# Patient Record
Sex: Female | Born: 1955
Health system: Southern US, Community
[De-identification: ages and names within clinical notes are randomized; demographics above are authoritative.]

## PROBLEM LIST (undated history)

## (undated) DIAGNOSIS — I10 Essential (primary) hypertension: Secondary | ICD-10-CM

## (undated) DIAGNOSIS — M1990 Unspecified osteoarthritis, unspecified site: Secondary | ICD-10-CM

## (undated) DIAGNOSIS — K635 Polyp of colon: Secondary | ICD-10-CM

## (undated) DIAGNOSIS — E119 Type 2 diabetes mellitus without complications: Secondary | ICD-10-CM

## (undated) DIAGNOSIS — G43909 Migraine, unspecified, not intractable, without status migrainosus: Secondary | ICD-10-CM

## (undated) DIAGNOSIS — E2839 Other primary ovarian failure: Secondary | ICD-10-CM

## (undated) HISTORY — DX: Unspecified osteoarthritis, unspecified site: M19.90

## (undated) HISTORY — DX: Migraine, unspecified, not intractable, without status migrainosus: G43.909

## (undated) HISTORY — PX: PARTIAL HYSTERECTOMY: SHX80

## (undated) HISTORY — DX: Type 2 diabetes mellitus without complications: E11.9

## (undated) HISTORY — DX: Polyp of colon: K63.5

---

## 2001-06-24 ENCOUNTER — Other Ambulatory Visit: Admission: RE | Admit: 2001-06-24 | Discharge: 2001-06-24 | Payer: Self-pay | Admitting: Obstetrics and Gynecology

## 2001-09-05 ENCOUNTER — Ambulatory Visit (HOSPITAL_COMMUNITY): Admission: RE | Admit: 2001-09-05 | Discharge: 2001-09-05 | Payer: Self-pay | Admitting: Family Medicine

## 2004-02-21 ENCOUNTER — Other Ambulatory Visit: Admission: RE | Admit: 2004-02-21 | Discharge: 2004-02-21 | Payer: Self-pay | Admitting: Internal Medicine

## 2004-03-06 ENCOUNTER — Encounter: Admission: RE | Admit: 2004-03-06 | Discharge: 2004-03-06 | Payer: Self-pay | Admitting: Internal Medicine

## 2005-08-21 ENCOUNTER — Encounter: Admission: RE | Admit: 2005-08-21 | Discharge: 2005-08-21 | Payer: Self-pay | Admitting: Internal Medicine

## 2005-08-31 ENCOUNTER — Encounter: Admission: RE | Admit: 2005-08-31 | Discharge: 2005-08-31 | Payer: Self-pay | Admitting: Internal Medicine

## 2006-02-19 ENCOUNTER — Other Ambulatory Visit: Admission: RE | Admit: 2006-02-19 | Discharge: 2006-02-19 | Payer: Self-pay | Admitting: Internal Medicine

## 2007-05-23 ENCOUNTER — Encounter: Admission: RE | Admit: 2007-05-23 | Discharge: 2007-05-23 | Payer: Self-pay | Admitting: Internal Medicine

## 2009-07-05 ENCOUNTER — Emergency Department (HOSPITAL_COMMUNITY): Admission: EM | Admit: 2009-07-05 | Discharge: 2009-07-06 | Payer: Self-pay | Admitting: Emergency Medicine

## 2010-05-31 ENCOUNTER — Other Ambulatory Visit: Admission: RE | Admit: 2010-05-31 | Discharge: 2010-05-31 | Payer: Self-pay | Admitting: Internal Medicine

## 2010-06-05 ENCOUNTER — Encounter: Admission: RE | Admit: 2010-06-05 | Discharge: 2010-06-05 | Payer: Self-pay | Admitting: Internal Medicine

## 2011-01-19 LAB — BASIC METABOLIC PANEL
Calcium: 9.7 mg/dL (ref 8.4–10.5)
Creatinine, Ser: 0.89 mg/dL (ref 0.4–1.2)
GFR calc non Af Amer: >60 mL/min (ref >60)
Potassium: 3.5 mEq/L (ref 3.5–5.1)
Sodium: 139 mEq/L (ref 135–145)

## 2011-01-19 LAB — POCT CARDIAC MARKERS
Myoglobin, poc: 125 ng/mL (ref 12–200)
Troponin i, poc: <0.05 ng/mL (ref 0.00–0.09)

## 2011-01-19 LAB — CBC
RBC: 4.85 MIL/uL (ref 3.87–5.11)
WBC: 10.1 10*3/uL (ref 4.0–10.5)

## 2011-01-19 LAB — DIFFERENTIAL
Basophils Relative: 0 % (ref 0–1)
Lymphs Abs: 1.8 10*3/uL (ref 0.7–4.0)
Monocytes Relative: 5 % (ref 3–12)
Neutro Abs: 7.8 10*3/uL — ABNORMAL HIGH (ref 1.7–7.7)
Neutrophils Relative %: 77 % (ref 43–77)

## 2012-10-22 ENCOUNTER — Emergency Department (HOSPITAL_COMMUNITY)
Admission: EM | Admit: 2012-10-22 | Discharge: 2012-10-22 | Disposition: A | Discharge status: Home / Self Care | Payer: BC Managed Care – PPO | Attending: Emergency Medicine | Admitting: Emergency Medicine

## 2012-10-22 ENCOUNTER — Encounter (HOSPITAL_COMMUNITY): Payer: Self-pay | Admitting: Emergency Medicine

## 2012-10-22 ENCOUNTER — Emergency Department (HOSPITAL_COMMUNITY): Payer: BC Managed Care – PPO

## 2012-10-22 DIAGNOSIS — Z79899 Other long term (current) drug therapy: Secondary | ICD-10-CM | POA: Insufficient documentation

## 2012-10-22 DIAGNOSIS — R1032 Left lower quadrant pain: Secondary | ICD-10-CM | POA: Insufficient documentation

## 2012-10-22 DIAGNOSIS — R809 Proteinuria, unspecified: Secondary | ICD-10-CM | POA: Insufficient documentation

## 2012-10-22 DIAGNOSIS — R319 Hematuria, unspecified: Secondary | ICD-10-CM

## 2012-10-22 DIAGNOSIS — N390 Urinary tract infection, site not specified: Secondary | ICD-10-CM | POA: Insufficient documentation

## 2012-10-22 DIAGNOSIS — E2839 Other primary ovarian failure: Secondary | ICD-10-CM | POA: Insufficient documentation

## 2012-10-22 DIAGNOSIS — I10 Essential (primary) hypertension: Secondary | ICD-10-CM | POA: Insufficient documentation

## 2012-10-22 HISTORY — DX: Other primary ovarian failure: E28.39

## 2012-10-22 HISTORY — DX: Essential (primary) hypertension: I10

## 2012-10-22 LAB — URINE MICROSCOPIC-ADD ON

## 2012-10-22 LAB — URINALYSIS, ROUTINE W REFLEX MICROSCOPIC
Glucose, UA: NEGATIVE mg/dL
Leukocytes, UA: NEGATIVE
Specific Gravity, Urine: >1.03 — ABNORMAL HIGH (ref 1.005–1.030)
pH: 5 (ref 5.0–8.0)

## 2012-10-22 LAB — CBC WITH DIFFERENTIAL/PLATELET
Basophils Absolute: 0 10*3/uL (ref 0.0–0.1)
Basophils Relative: 0 % (ref 0–1)
Eosinophils Relative: 1 % (ref 0–5)
Lymphocytes Relative: 28 % (ref 12–46)
MCHC: 33.1 g/dL (ref 30.0–36.0)
MCV: 81.7 fL (ref 78.0–100.0)
Platelets: 290 10*3/uL (ref 150–400)
RDW: 13.5 % (ref 11.5–15.5)
WBC: 10.9 10*3/uL — ABNORMAL HIGH (ref 4.0–10.5)

## 2012-10-22 LAB — COMPREHENSIVE METABOLIC PANEL
ALT: 19 U/L (ref 0–35)
AST: 19 U/L (ref 0–37)
Albumin: 4.3 g/dL (ref 3.5–5.2)
CO2: 26 mEq/L (ref 19–32)
Calcium: 10.6 mg/dL — ABNORMAL HIGH (ref 8.4–10.5)
GFR calc non Af Amer: >90 mL/min (ref >90)
Sodium: 139 mEq/L (ref 135–145)
Total Protein: 8.5 g/dL — ABNORMAL HIGH (ref 6.0–8.3)

## 2012-10-22 MED ORDER — NAPROXEN 500 MG PO TABS: 500.0000 mg | ORAL_TABLET | Freq: Two times a day (BID) | ORAL | Status: DC

## 2012-10-22 MED ORDER — ONDANSETRON 4 MG PO TBDP: 4.0000 mg | ORAL_TABLET | Freq: Three times a day (TID) | ORAL | Status: DC | PRN

## 2012-10-22 MED ORDER — SULFAMETHOXAZOLE-TRIMETHOPRIM 800-160 MG PO TABS: 1.0000 | ORAL_TABLET | Freq: Two times a day (BID) | ORAL | Status: DC

## 2012-10-22 NOTE — ED Provider Notes (Signed)
History     CSN: [id number]  Arrival date & time 10/22/12  0010   First MD Initiated Contact with Patient 10/22/12 0239      Chief Complaint  Patient presents with  . Hematuria  . Flank Pain    (Consider location/radiation/quality/duration/timing/severity/associated sxs/prior treatment) HPI Comments: 57 year old female who presents with gradual onset of left lower quadrant abdominal pain. This is associated with hematuria and radiates to her left lower back. Nothing seems to make it better or worse, it is persistent, achy and associated with nausea. She denies a history of kidney stones but does have a history of urinary tract infection a long time ago. She is not diabetic, she is not immunocompromised and she denies fevers or chills. Her symptoms started in the last 12 hours  Patient is a 57 y.o. female presenting with hematuria and flank pain. The history is provided by the patient and a relative.  Hematuria Associated symptoms include flank pain.  Flank Pain    Past Medical History  Diagnosis Date  . Hypertension   . Estrogen deficiency     Past Surgical History  Procedure Date  . Partial hysterectomy     History reviewed. No pertinent family history.  History  Substance Use Topics  . Smoking status: Never Smoker   . Smokeless tobacco: Not on file  . Alcohol Use: No    OB History    Grav Para Term Preterm Abortions TAB SAB Ect Mult Living                  Review of Systems  Genitourinary: Positive for hematuria and flank pain.  All other systems reviewed and are negative.    Allergies  Review of patient's allergies indicates no known allergies.  Home Medications   Current Outpatient Rx  Name  Route  Sig  Dispense  Refill  . AMLODIPINE BESYLATE 5 MG PO TABS   Oral   Take 5 mg by mouth daily.         Marland Kitchen ESTRADIOL 1 MG PO TABS   Oral   Take 1 mg by mouth daily.         Marland Kitchen LISINOPRIL-HYDROCHLOROTHIAZIDE 20-12.5 MG PO TABS   Oral   Take 1  tablet by mouth daily.         Marland Kitchen NAPROXEN 500 MG PO TABS   Oral   Take 1 tablet (500 mg total) by mouth 2 (two) times daily with a meal.   30 tablet   0   . ONDANSETRON 4 MG PO TBDP   Oral   Take 1 tablet (4 mg total) by mouth every 8 (eight) hours as needed for nausea.   10 tablet   0   . SULFAMETHOXAZOLE-TRIMETHOPRIM 800-160 MG PO TABS   Oral   Take 1 tablet by mouth every 12 (twelve) hours.   14 tablet   0     BP 145/89  Pulse 84  Temp 98.6 F (37 C) (Oral)  Resp 16  SpO2 100%  Physical Exam  Nursing note and vitals reviewed. Constitutional: She appears well-developed and well-nourished. No distress.  HENT:  Head: Normocephalic and atraumatic.  Mouth/Throat: Oropharynx is clear and moist. No oropharyngeal exudate.  Eyes: Conjunctivae normal and EOM are normal. Pupils are equal, round, and reactive to light. Right eye exhibits no discharge. Left eye exhibits no discharge. No scleral icterus.  Neck: Normal range of motion. Neck supple. No JVD present. No thyromegaly present.  Cardiovascular: Normal rate, regular rhythm,  normal heart sounds and intact distal pulses.  Exam reveals no gallop and no friction rub.   No murmur heard. Pulmonary/Chest: Effort normal and breath sounds normal. No respiratory distress. She has no wheezes. She has no rales.  Abdominal: Soft. Bowel sounds are normal. She exhibits no distension and no mass. There is no tenderness.  Musculoskeletal: Normal range of motion. She exhibits no edema and no tenderness.  Lymphadenopathy:    She has no cervical adenopathy.  Neurological: She is alert. Coordination normal.  Skin: Skin is warm and dry. No rash noted. No erythema.  Psychiatric: She has a normal mood and affect. Her behavior is normal.    ED Course  Procedures (including critical care time)  Labs Reviewed  CBC WITH DIFFERENTIAL - Abnormal; Notable for the following:    WBC 10.9 (*)     All other components within normal limits    COMPREHENSIVE METABOLIC PANEL - Abnormal; Notable for the following:    Glucose, Bld 118 (*)     Calcium 10.6 (*)     Total Protein 8.5 (*)     Total Bilirubin 0.2 (*)     All other components within normal limits  URINALYSIS, ROUTINE W REFLEX MICROSCOPIC - Abnormal; Notable for the following:    Color, Urine AMBER (*)  BIOCHEMICALS MAY BE AFFECTED BY COLOR   APPearance TURBID (*)     Specific Gravity, Urine >1.030 (*)     Hgb urine dipstick LARGE (*)     Bilirubin Urine SMALL (*)     Protein, ur 100 (*)     Nitrite POSITIVE (*)     All other components within normal limits  URINE MICROSCOPIC-ADD ON - Abnormal; Notable for the following:    Bacteria, UA FEW (*)     All other components within normal limits  LIPASE, BLOOD  URINALYSIS, ROUTINE W REFLEX MICROSCOPIC  URINE CULTURE   Ct Abdomen Pelvis Wo Contrast  10/22/2012  *RADIOLOGY REPORT*  Clinical Data: Hematuria and left flank pain.  CT ABDOMEN AND PELVIS WITHOUT CONTRAST  Technique:  Multidetector CT imaging of the abdomen and pelvis was performed following the standard protocol without intravenous contrast.  Comparison: None.  Findings: The lung bases are clear.  No pleural or pericardial effusion.  There are no renal or ureteral stones and no hydronephrosis.  The kidneys appear normal bilaterally.  The gallbladder, liver, adrenal glands, spleen and pancreas all appear normal.  The patient is status post hysterectomy.  The stomach, small and large bowel and appendix are unremarkable.  There is no lymphadenopathy or fluid. No focal bony abnormality is identified.  IMPRESSION: Negative for urinary tract stone.  Negative exam.   Original Report Authenticated By: Holley Dexter, M.D.      1. UTI (lower urinary tract infection)   2. Hematuria       MDM  The patient is well-appearing with normal vital signs other than hypertension. She has a nontender soft abdomen and no CVA tenderness. Her urinalysis is consistent with  significant hematuria, she also has nitrite positive. There are few bacteria, 3-6 white blood cells. Her blood counts are essentially normal as is her renal function. CT scan of the abdomen and pelvis without contrast to evaluate for kidney stone, the patient declines pain medications or nausea medicines at this time.   CT scan reviewed, there is no signs of abnormality in the abdomen or pelvis, the patient has remained stable throughout her stay, she will be given Bactrim prescription, instructions to  return if symptoms worsen and to followup with family Dr. to evaluate for clearing of hematuria. Patient expresses her understanding.     Vida Roller, MD 10/22/12 [phone]

## 2012-10-22 NOTE — ED Notes (Signed)
Patient complaining of hematuria and left sided flank pain; reports drinking lots of water and cranberry juice today.  Denies history of kidney stone.  Reports nausea; denies vomiting.

## 2012-10-23 LAB — URINE CULTURE

## 2013-02-27 ENCOUNTER — Other Ambulatory Visit: Payer: Self-pay | Admitting: Internal Medicine

## 2013-02-27 ENCOUNTER — Ambulatory Visit
Admission: RE | Admit: 2013-02-27 | Discharge: 2013-02-27 | Disposition: A | Discharge status: Home / Self Care | Payer: BC Managed Care – PPO | Source: Ambulatory Visit | Attending: Internal Medicine | Admitting: Internal Medicine

## 2013-02-27 DIAGNOSIS — Z1231 Encounter for screening mammogram for malignant neoplasm of breast: Secondary | ICD-10-CM

## 2013-03-16 ENCOUNTER — Ambulatory Visit: Payer: BC Managed Care – PPO

## 2013-03-30 ENCOUNTER — Other Ambulatory Visit (HOSPITAL_COMMUNITY)
Admission: RE | Admit: 2013-03-30 | Discharge: 2013-03-30 | Disposition: A | Discharge status: Home / Self Care | Payer: BC Managed Care – PPO | Source: Ambulatory Visit | Attending: Internal Medicine | Admitting: Internal Medicine

## 2013-03-30 ENCOUNTER — Other Ambulatory Visit: Payer: Self-pay | Admitting: Internal Medicine

## 2013-03-30 DIAGNOSIS — Z01419 Encounter for gynecological examination (general) (routine) without abnormal findings: Secondary | ICD-10-CM | POA: Insufficient documentation

## 2013-04-20 ENCOUNTER — Ambulatory Visit (INDEPENDENT_AMBULATORY_CARE_PROVIDER_SITE_OTHER): Payer: BC Managed Care – PPO | Admitting: Neurology

## 2013-04-20 ENCOUNTER — Encounter: Payer: Self-pay | Admitting: Neurology

## 2013-04-20 VITALS — BP 143/93 | HR 89 | Temp 97.2°F | Ht 65.0 in | Wt 172.0 lb

## 2013-04-20 DIAGNOSIS — R209 Unspecified disturbances of skin sensation: Secondary | ICD-10-CM

## 2013-04-20 DIAGNOSIS — R202 Paresthesia of skin: Secondary | ICD-10-CM

## 2013-04-20 DIAGNOSIS — R200 Anesthesia of skin: Secondary | ICD-10-CM

## 2013-04-20 NOTE — Patient Instructions (Signed)
I think overall you are doing fairly well but I do want to suggest a few things today:  Remember to drink plenty of fluid, eat healthy meals and do not skip any meals. Try to eat protein with a every meal and eat a healthy snack such as fruit or nuts in between meals. Try to keep a regular sleep-wake schedule and try to exercise daily, particularly in the form of walking, 20-30 minutes a day, if you can.   As far as your medications are concerned, I would like to suggest    As far as diagnostic testing:   I would like to see you back in 3 months, sooner if we need to. Please call us with any interim questions, concerns, problems, updates or refill requests.  Please also call us for any test results so we can go over those with you on the phone. Steve is my clinical assistant and will answer any of your questions and relay your messages to me and also relay most of my messages to you.  Our phone number is [phone]. We also have an after hours call service for urgent matters and there is a physician on-call for urgent questions. For any emergencies you know to call 911 or go to the nearest emergency room.     

## 2013-04-20 NOTE — Progress Notes (Signed)
Subjective:    Patient ID: Terry [name] is a 57 y.o. female.  HPI  Huston Foley, MD, PhD [facility] [address], [address] [address] [place], [place] [address]  Dear Dr. Donette Larry,   I saw your patient, Terry [name], upon your kind request in my neurologic clinic today for initial consultation of her right-sided numbness and tingling. The patient is unaccompanied today. As you know, Terry [name] is a very pleasant 57 year old right-handed woman with an underlying medical history of hypertension, reflux disease, migraine headaches, allergic rhinitis, IBS, OA, and colonic polyps who has been experiencing right-sided numbness and tingling around the upper chest area and into the right axilla for the past several weeks, now approximately. It started with a stinging and burning sensation in the R breast, and the R nipple and into the R shoulder blade area as well as the R axilla; she was empirically treated for shingles. After 2-3 weeks the painful sensation subsided, but she still has numbness. She had a CXR and a back X-ray. Workup has included B12 level, chemistry, ESR, all reported normal. She has had no rash, no changes to her breast or nipple, no discharge, no peau d'orange. She reports no weakness or clumsiness, no tremor. She denies any radiating neck pain or back pain. She was treated symptomatically with the Lyrica without benefit and reported side effects including drowsiness. A course of acyclovir your did not help even though she did not have a vesicular rash. She had a recent mammogram which was normal in April. Her current medications include Claritin, amlodipine, lisinopril-hydrochlorothiazide, estradiol, and she recently discontinued Lyrica. There is no hx of trauma to the back.  She has an elderly mother with AD, whom she helps take care of. She has a sister, age 38, who was diagnosed with breast ca in her late 65s or early 61s.   Her Past Medical History Is  Significant For: Past Medical History  Diagnosis Date  . Hypertension   . Estrogen deficiency   . Migraine    Her Past Surgical History Is Significant For: Past Surgical History  Procedure Laterality Date  . Partial hysterectomy     Her Family History Is Significant For: Family History  Problem Relation Age of Onset  . Dementia Mother   . Bladder Cancer Father    Her Social History Is Significant For: History   Social History  . Marital Status: Single    Spouse Name: N/A    Number of Children: 2  . Years of Education: 12th   Occupational History  .  [employer]   Social History Main Topics  . Smoking status: Never Smoker   . Smokeless tobacco: Never Used  . Alcohol Use: Yes     Comment: socially  . Drug Use: No  . Sexually Active: None   Other Topics Concern  . None   Social History Narrative   Pt lives at home with family.   Caffeine Use: 1-2 cups occasionally    Her Allergies Are:  No Known Allergies:   Her Current Medications Are:  Outpatient Encounter Prescriptions as of 04/20/2013  Medication Sig Dispense Refill  . amLODipine (NORVASC) 5 MG tablet Take 5 mg by mouth daily.      Marland Kitchen estradiol (ESTRACE) 1 MG tablet Take 1 mg by mouth daily.      Marland Kitchen lisinopril-hydrochlorothiazide (PRINZIDE,ZESTORETIC) 20-12.5 MG per tablet Take 1 tablet by mouth daily.      . [DISCONTINUED] naproxen (NAPROSYN) 500  MG tablet Take 1 tablet (500 mg total) by mouth 2 (two) times daily with a meal.  30 tablet  0  . [DISCONTINUED] ondansetron (ZOFRAN ODT) 4 MG disintegrating tablet Take 1 tablet (4 mg total) by mouth every 8 (eight) hours as needed for nausea.  10 tablet  0  . [DISCONTINUED] sulfamethoxazole-trimethoprim (SEPTRA DS) 800-160 MG per tablet Take 1 tablet by mouth every 12 (twelve) hours.  14 tablet  0   No facility-administered encounter medications on file as of 04/20/2013.   Review of Systems  Neurological: Positive for numbness.    Objective:   Neurologic Exam  Physical Exam Physical Examination:   Filed Vitals:   04/20/13 0829  BP: 143/93  Pulse: 89  Temp: 97.2 F (36.2 C)    General Examination: The patient is a very pleasant 57 y.o. female in no acute distress. She appears well-developed and well-nourished and well groomed.   HEENT: Normocephalic, atraumatic, pupils are equal, round and reactive to light and accommodation. Funduscopic exam is normal with sharp disc margins noted. Extraocular tracking is good without limitation to gaze excursion or nystagmus noted. Normal smooth pursuit is noted. Hearing is grossly intact. Tympanic membranes are clear bilaterally. Face is symmetric with normal facial animation and normal facial sensation. Speech is clear with no dysarthria noted. There is no hypophonia. There is no lip, neck/head, jaw or voice tremor. Neck is supple with full range of passive and active motion. There are no carotid bruits on auscultation. Oropharynx exam reveals: mild mouth dryness, adequate dental hygiene and moderate airway crowding, due to large tongue and narrow airway. Mallampati is class II. Tongue protrudes centrally and palate elevates symmetrically. No cervical lymphadenopathy is noted.  Chest: Clear to auscultation without wheezing, rhonchi or crackles noted.  Heart: S1+S2+0, regular and normal without murmurs, rubs or gallops noted.   Abdomen: Soft, non-tender and non-distended with normal bowel sounds appreciated on auscultation.  Extremities: There is no pitting edema in the distal lower extremities bilaterally. Pedal pulses are intact.  Skin: Warm and dry without trophic changes noted. There are no varicose veins.  Musculoskeletal: exam reveals no obvious joint deformities, tenderness or joint swelling or erythema.   Neurologically:  Mental status: The patient is awake, alert and oriented in all 4 spheres. Her memory, attention, language and knowledge are appropriate. There is no aphasia,  agnosia, apraxia or anomia. Speech is clear with normal prosody and enunciation. Thought process is linear. Mood is congruent and affect is normal.  Cranial nerves are as described above under HEENT exam. In addition, shoulder shrug is normal with equal shoulder height noted. Motor exam: Normal bulk, strength and tone is noted. There is no drift, tremor or rebound. Romberg is negative. Reflexes are only trace throughout. Toes are downgoing bilaterally. Fine motor skills are intact with normal finger taps, normal hand movements, normal rapid alternating patting, normal foot taps and normal foot agility.  Cerebellar testing shows no dysmetria or intention tremor on finger to nose testing. Heel to shin is unremarkable bilaterally. There is no truncal or gait ataxia.  Sensory exam is intact to light touch, pinprick, vibration, temperature sense and proprioception in the upper and lower extremities with the exception of slightly decreased pinprick sensation in the right breast area in the T9 distribution. Her axilla has normal sensation as well as the right shoulder blade and deltoid area. She has no lymphadenopathy in her cervical area her supra-clavicular area or right axilla. She has no palpable breast mass. Gait, station  and balance are unremarkable. No veering to one side is noted. No leaning to one side is noted. Posture is age-appropriate and stance is narrow based. No problems turning are noted. She turns en bloc. Tandem walk is unremarkable. Intact toe and heel stance is noted.               Assessment and Plan:   Assessment and Plan:  In summary, TENESHA GARZA is a very pleasant 58 y.o.-year old female with a history of burning sensation, tingling and numbness in the right breast area, right axilla, and right shoulder blade area. While the burning sensation and   uncomfortable sensation have subsided, she still reports numbness in those areas. Objectively she has normal sensation with the exception  of mild decrease in pinprick sensation in the right breast area. Otherwise her exam is benign and nonfocal and I reassured the patient in that regard.  I had a long chat with the patient about my findings and her symptoms and I am not sure how to tie this all in together. Nevertheless I think it would be important to rule out any cervical or thoracic cord lesion. This is not present like a TIA or stroke as you know. Therefore, I will order a cervical and thoracic spine MRI at this time. I would like see her back in a couple of months for recheck. To some degree she has improved in that the burning sensation has subsided. We may have to watch and wait it out. We will be calling her with her test results. I did not order any blood work today.  Thank you very much for allowing me to participate in the care of this nice patient. If I can be of any further assistance to you please do not hesitate to call me at [phone].  Sincerely,   Huston Foley, MD, PhD

## 2013-07-27 ENCOUNTER — Ambulatory Visit: Payer: BC Managed Care – PPO | Admitting: Neurology

## 2014-06-24 ENCOUNTER — Other Ambulatory Visit: Payer: Self-pay | Admitting: Internal Medicine

## 2014-06-24 DIAGNOSIS — R142 Eructation: Secondary | ICD-10-CM

## 2014-06-30 ENCOUNTER — Ambulatory Visit
Admission: RE | Admit: 2014-06-30 | Discharge: 2014-06-30 | Disposition: A | Discharge status: Home / Self Care | Payer: BC Managed Care – PPO | Source: Ambulatory Visit | Attending: Internal Medicine | Admitting: Internal Medicine

## 2014-06-30 DIAGNOSIS — R142 Eructation: Secondary | ICD-10-CM

## 2014-09-01 ENCOUNTER — Other Ambulatory Visit: Payer: Self-pay | Admitting: Internal Medicine

## 2014-09-01 ENCOUNTER — Ambulatory Visit
Admission: RE | Admit: 2014-09-01 | Discharge: 2014-09-01 | Disposition: A | Discharge status: Home / Self Care | Payer: BC Managed Care – PPO | Source: Ambulatory Visit | Attending: Internal Medicine | Admitting: Internal Medicine

## 2014-09-01 ENCOUNTER — Encounter (HOSPITAL_COMMUNITY): Payer: Self-pay | Admitting: *Deleted

## 2014-09-01 ENCOUNTER — Emergency Department (HOSPITAL_COMMUNITY)
Admission: EM | Admit: 2014-09-01 | Discharge: 2014-09-01 | Disposition: A | Discharge status: Home / Self Care | Payer: BC Managed Care – PPO | Attending: Emergency Medicine | Admitting: Emergency Medicine

## 2014-09-01 DIAGNOSIS — G43909 Migraine, unspecified, not intractable, without status migrainosus: Secondary | ICD-10-CM | POA: Insufficient documentation

## 2014-09-01 DIAGNOSIS — I10 Essential (primary) hypertension: Secondary | ICD-10-CM | POA: Insufficient documentation

## 2014-09-01 DIAGNOSIS — Z79899 Other long term (current) drug therapy: Secondary | ICD-10-CM | POA: Insufficient documentation

## 2014-09-01 DIAGNOSIS — R112 Nausea with vomiting, unspecified: Secondary | ICD-10-CM | POA: Insufficient documentation

## 2014-09-01 DIAGNOSIS — E860 Dehydration: Secondary | ICD-10-CM | POA: Diagnosis present

## 2014-09-01 DIAGNOSIS — F502 Bulimia nervosa: Secondary | ICD-10-CM

## 2014-09-01 LAB — COMPREHENSIVE METABOLIC PANEL
ALT: 17 U/L (ref 0–35)
AST: 17 U/L (ref 0–37)
Albumin: 4.4 g/dL (ref 3.5–5.2)
Alkaline Phosphatase: 43 U/L (ref 39–117)
Anion gap: 19 — ABNORMAL HIGH (ref 5–15)
BUN: 19 mg/dL (ref 6–23)
CALCIUM: 10.3 mg/dL (ref 8.4–10.5)
CO2: 25 mEq/L (ref 19–32)
Chloride: 98 mEq/L (ref 96–112)
Creatinine, Ser: 0.69 mg/dL (ref 0.50–1.10)
GFR calc Af Amer: >90 mL/min (ref >90)
GFR calc non Af Amer: >90 mL/min (ref >90)
Glucose, Bld: 147 mg/dL — ABNORMAL HIGH (ref 70–99)
Potassium: 3.5 mEq/L — ABNORMAL LOW (ref 3.7–5.3)
SODIUM: 142 meq/L (ref 137–147)
TOTAL PROTEIN: 9 g/dL — AB (ref 6.0–8.3)
Total Bilirubin: 0.4 mg/dL (ref 0.3–1.2)

## 2014-09-01 LAB — CBC WITH DIFFERENTIAL/PLATELET
BASOS ABS: 0 10*3/uL (ref 0.0–0.1)
Basophils Relative: 0 % (ref 0–1)
EOS ABS: 0 10*3/uL (ref 0.0–0.7)
EOS PCT: 0 % (ref 0–5)
HCT: 45.4 % (ref 36.0–46.0)
Hemoglobin: 15 g/dL (ref 12.0–15.0)
LYMPHS PCT: 13 % (ref 12–46)
Lymphs Abs: 1.5 10*3/uL (ref 0.7–4.0)
MCH: 27.5 pg (ref 26.0–34.0)
MCHC: 33 g/dL (ref 30.0–36.0)
MCV: 83.2 fL (ref 78.0–100.0)
Monocytes Absolute: 0.5 10*3/uL (ref 0.1–1.0)
Monocytes Relative: 4 % (ref 3–12)
Neutro Abs: 9.7 10*3/uL — ABNORMAL HIGH (ref 1.7–7.7)
Neutrophils Relative %: 83 % — ABNORMAL HIGH (ref 43–77)
PLATELETS: 369 10*3/uL (ref 150–400)
RBC: 5.46 MIL/uL — ABNORMAL HIGH (ref 3.87–5.11)
RDW: 13.7 % (ref 11.5–15.5)
WBC: 11.7 10*3/uL — AB (ref 4.0–10.5)

## 2014-09-01 LAB — LIPASE, BLOOD: Lipase: 21 U/L (ref 11–59)

## 2014-09-01 MED ORDER — ONDANSETRON 8 MG PO TBDP: 8.0000 mg | ORAL_TABLET | Freq: Three times a day (TID) | ORAL | Status: DC | PRN

## 2014-09-01 MED ORDER — ONDANSETRON HCL 4 MG/2ML IJ SOLN
4.0000 mg | Freq: Once | INTRAMUSCULAR | Status: AC
  Administered 2014-09-01: 4 mg via INTRAVENOUS
  Filled 2014-09-01: qty 2

## 2014-09-01 MED ORDER — SODIUM CHLORIDE 0.9 % IV BOLUS (SEPSIS)
1000.0000 mL | Freq: Once | INTRAVENOUS | Status: AC
  Administered 2014-09-01: 1000 mL via INTRAVENOUS

## 2014-09-01 NOTE — ED Notes (Signed)
Patient not able to void at this time. Patient aware we need a ua.

## 2014-09-01 NOTE — ED Notes (Signed)
Pt states she cannot void quite yet.

## 2014-09-01 NOTE — Discharge Instructions (Signed)

## 2014-09-01 NOTE — ED Provider Notes (Signed)
CSN: [id number]     Arrival date & time 09/01/14  1722 History   First MD Initiated Contact with Patient 09/01/14 1817     Chief Complaint  Patient presents with  . Emesis  . Dehydration     (Consider location/radiation/quality/duration/timing/severity/associated sxs/prior Treatment) Patient is a 58 y.o. female presenting with vomiting.  Emesis Associated symptoms: no abdominal pain, no diarrhea and no headaches    patient with nausea and vomiting. Has had it the last couple days. She's had some lightheadedness. No abdominal pain or diarrhea. No chest pain. No fevers. No history of same. Was seen in primary care doctor and was told to come to the ER because of dehydration. No dysuria. No sick contacts.  Past Medical History  Diagnosis Date  . Hypertension   . Estrogen deficiency   . Migraine    Past Surgical History  Procedure Laterality Date  . Partial hysterectomy     Family History  Problem Relation Age of Onset  . Dementia Mother   . Bladder Cancer Father    History  Substance Use Topics  . Smoking status: Never Smoker   . Smokeless tobacco: Never Used  . Alcohol Use: Yes     Comment: socially   OB History    No data available     Review of Systems  Constitutional: Negative for activity change and appetite change.  Eyes: Negative for pain.  Respiratory: Negative for chest tightness and shortness of breath.   Cardiovascular: Negative for chest pain and leg swelling.  Gastrointestinal: Positive for nausea and vomiting. Negative for abdominal pain and diarrhea.  Genitourinary: Negative for flank pain.  Musculoskeletal: Negative for back pain and neck stiffness.  Skin: Negative for rash.  Neurological: Positive for light-headedness. Negative for weakness, numbness and headaches.  Psychiatric/Behavioral: Negative for behavioral problems.      Allergies  Review of patient's allergies indicates no known allergies.  Home Medications   Prior to Admission  medications   Medication Sig Start Date End Date Taking? Authorizing Provider  amLODipine (NORVASC) 5 MG tablet Take 5 mg by mouth daily.   Yes Historical Provider, MD  estradiol (ESTRACE) 1 MG tablet Take 1 mg by mouth daily.   Yes Historical Provider, MD  imipramine (TOFRANIL) 25 MG tablet Take 25 mg by mouth at bedtime.   Yes Historical Provider, MD  lisinopril-hydrochlorothiazide (PRINZIDE,ZESTORETIC) 20-12.5 MG per tablet Take 1 tablet by mouth daily.   Yes Historical Provider, MD  omeprazole (PRILOSEC) 40 MG capsule Take 40 mg by mouth daily.   Yes Historical Provider, MD  ondansetron (ZOFRAN-ODT) 8 MG disintegrating tablet Take 1 tablet (8 mg total) by mouth every 8 (eight) hours as needed for nausea or vomiting. 09/01/14   Juliet RudeNathan R. Paulett Kaufhold, MD   BP 117/58 mmHg  Pulse 79  Temp(Src) 98.7 F (37.1 C) (Oral)  Resp 17  Ht 5\' 5"  (1.651 m)  Wt 158 lb (71.668 kg)  BMI 26.29 kg/m2  SpO2 96% Physical Exam  Constitutional: She is oriented to person, place, and time. She appears well-developed and well-nourished.  HENT:  Head: Normocephalic and atraumatic.  Cardiovascular: Normal rate, regular rhythm and normal heart sounds.   No murmur heard. Pulmonary/Chest: Effort normal and breath sounds normal. No respiratory distress. She has no wheezes. She has no rales.  Abdominal: Soft. She exhibits no distension. There is no tenderness. There is no rebound and no guarding.  Musculoskeletal: She exhibits no edema or tenderness.  Neurological: She is alert and oriented to  person, place, and time.  Skin: Skin is warm and dry.  Psychiatric: She has a normal mood and affect. Her speech is normal.  Nursing note and vitals reviewed.   ED Course  Procedures (including critical care time) Labs Review Labs Reviewed  CBC WITH DIFFERENTIAL - Abnormal; Notable for the following:    WBC 11.7 (*)    RBC 5.46 (*)    Neutrophils Relative % 83 (*)    Neutro Abs 9.7 (*)    All other components within  normal limits  COMPREHENSIVE METABOLIC PANEL - Abnormal; Notable for the following:    Potassium 3.5 (*)    Glucose, Bld 147 (*)    Total Protein 9.0 (*)    Anion gap 19 (*)    All other components within normal limits  LIPASE, BLOOD    Imaging Review Dg Abd 1 View  09/01/2014   CLINICAL DATA:  Vomiting, nausea, loss of appetite recently  EXAM: ABDOMEN - 1 VIEW  COMPARISON:  Upper GI of 06/30/2014  FINDINGS: Supine views of the abdomen show no bowel obstruction. No constipation is seen. In review of the upper GI, the distal antrum is slightly narrowed on several images. If there is suspicion of an infiltrative process than endoscopy would be recommended.  IMPRESSION: No bowel obstruction. No opaque calculi. Consider endoscopy as noted above if further assessment of the stomach is warranted.   Electronically Signed   By: Dwyane DeePaul  Barry M.D.   On: 09/01/2014 17:15     EKG Interpretation None      MDM   Final diagnoses:  Non-intractable vomiting with nausea, vomiting of unspecified type    Patient with nausea and vomiting. Feels better after treatment. Lab work overall reassuring. Has tolerated orals after Zofran and IV fluids. Will follow-up with PCP as needed    Juliet RudeNathan R. Rubin PayorPickering, MD 09/01/14 2352

## 2014-09-01 NOTE — ED Notes (Signed)
Pt reports having n/v since Sunday night. Denies abd pain or vomiting. Reports last bm was Friday or Saturday. Sent here by pcp for treatment of dehydration. No acute distress noted at triage.

## 2015-01-07 ENCOUNTER — Other Ambulatory Visit: Payer: Self-pay | Admitting: Orthopedic Surgery

## 2015-01-07 DIAGNOSIS — M79672 Pain in left foot: Secondary | ICD-10-CM

## 2015-01-11 ENCOUNTER — Ambulatory Visit
Admission: RE | Admit: 2015-01-11 | Discharge: 2015-01-11 | Disposition: A | Discharge status: Home / Self Care | Payer: PRIVATE HEALTH INSURANCE | Source: Ambulatory Visit | Attending: Orthopedic Surgery | Admitting: Orthopedic Surgery

## 2015-01-11 DIAGNOSIS — M79672 Pain in left foot: Secondary | ICD-10-CM

## 2015-08-29 ENCOUNTER — Other Ambulatory Visit: Payer: Self-pay | Admitting: Internal Medicine

## 2015-08-29 DIAGNOSIS — Z1231 Encounter for screening mammogram for malignant neoplasm of breast: Secondary | ICD-10-CM

## 2015-09-19 ENCOUNTER — Ambulatory Visit
Admission: RE | Admit: 2015-09-19 | Discharge: 2015-09-19 | Disposition: A | Discharge status: Home / Self Care | Payer: Managed Care, Other (non HMO) | Source: Ambulatory Visit | Attending: Internal Medicine | Admitting: Internal Medicine

## 2015-09-19 DIAGNOSIS — Z1231 Encounter for screening mammogram for malignant neoplasm of breast: Secondary | ICD-10-CM

## 2016-04-27 ENCOUNTER — Other Ambulatory Visit: Payer: Self-pay | Admitting: Internal Medicine

## 2016-04-27 DIAGNOSIS — Z1231 Encounter for screening mammogram for malignant neoplasm of breast: Secondary | ICD-10-CM

## 2016-04-30 ENCOUNTER — Ambulatory Visit
Admission: RE | Admit: 2016-04-30 | Discharge: 2016-04-30 | Disposition: A | Discharge status: Home / Self Care | Payer: Managed Care, Other (non HMO) | Source: Ambulatory Visit | Attending: Internal Medicine | Admitting: Internal Medicine

## 2016-04-30 ENCOUNTER — Other Ambulatory Visit: Payer: Self-pay | Admitting: Internal Medicine

## 2016-04-30 DIAGNOSIS — M25511 Pain in right shoulder: Secondary | ICD-10-CM

## 2016-04-30 DIAGNOSIS — N644 Mastodynia: Secondary | ICD-10-CM

## 2016-05-04 ENCOUNTER — Ambulatory Visit
Admission: RE | Admit: 2016-05-04 | Discharge: 2016-05-04 | Disposition: A | Discharge status: Home / Self Care | Payer: Managed Care, Other (non HMO) | Source: Ambulatory Visit | Attending: Internal Medicine | Admitting: Internal Medicine

## 2016-05-04 DIAGNOSIS — N644 Mastodynia: Secondary | ICD-10-CM

## 2017-02-18 ENCOUNTER — Other Ambulatory Visit (HOSPITAL_COMMUNITY)
Admission: RE | Admit: 2017-02-18 | Discharge: 2017-02-18 | Disposition: A | Discharge status: Home / Self Care | Payer: Managed Care, Other (non HMO) | Source: Ambulatory Visit | Attending: Obstetrics and Gynecology | Admitting: Obstetrics and Gynecology

## 2017-02-18 ENCOUNTER — Other Ambulatory Visit: Payer: Self-pay | Admitting: Obstetrics and Gynecology

## 2017-02-18 DIAGNOSIS — Z01411 Encounter for gynecological examination (general) (routine) with abnormal findings: Secondary | ICD-10-CM | POA: Insufficient documentation

## 2017-02-18 DIAGNOSIS — Z1151 Encounter for screening for human papillomavirus (HPV): Secondary | ICD-10-CM | POA: Insufficient documentation

## 2017-02-21 LAB — CYTOLOGY - PAP: HPV (WINDOPATH): NOT DETECTED

## 2017-05-13 ENCOUNTER — Other Ambulatory Visit: Payer: Self-pay | Admitting: Internal Medicine

## 2017-05-13 ENCOUNTER — Ambulatory Visit
Admission: RE | Admit: 2017-05-13 | Discharge: 2017-05-13 | Disposition: A | Discharge status: Home / Self Care | Payer: Managed Care, Other (non HMO) | Source: Ambulatory Visit | Attending: Internal Medicine | Admitting: Internal Medicine

## 2017-05-13 DIAGNOSIS — M79641 Pain in right hand: Secondary | ICD-10-CM

## 2017-05-13 DIAGNOSIS — M79642 Pain in left hand: Principal | ICD-10-CM

## 2017-07-15 ENCOUNTER — Other Ambulatory Visit: Payer: Self-pay | Admitting: Internal Medicine

## 2017-07-15 DIAGNOSIS — Z1239 Encounter for other screening for malignant neoplasm of breast: Secondary | ICD-10-CM

## 2017-07-16 ENCOUNTER — Ambulatory Visit
Admission: RE | Admit: 2017-07-16 | Discharge: 2017-07-16 | Disposition: A | Discharge status: Home / Self Care | Payer: Managed Care, Other (non HMO) | Source: Ambulatory Visit | Attending: Internal Medicine | Admitting: Internal Medicine

## 2017-07-16 DIAGNOSIS — Z1239 Encounter for other screening for malignant neoplasm of breast: Secondary | ICD-10-CM

## 2017-07-18 ENCOUNTER — Other Ambulatory Visit: Payer: Self-pay | Admitting: Internal Medicine

## 2017-07-18 DIAGNOSIS — R928 Other abnormal and inconclusive findings on diagnostic imaging of breast: Secondary | ICD-10-CM

## 2017-07-22 ENCOUNTER — Ambulatory Visit: Admission: RE | Admit: 2017-07-22 | Payer: Managed Care, Other (non HMO) | Source: Ambulatory Visit

## 2017-07-22 ENCOUNTER — Ambulatory Visit
Admission: RE | Admit: 2017-07-22 | Discharge: 2017-07-22 | Disposition: A | Discharge status: Home / Self Care | Payer: Managed Care, Other (non HMO) | Source: Ambulatory Visit | Attending: Internal Medicine | Admitting: Internal Medicine

## 2017-07-22 DIAGNOSIS — R928 Other abnormal and inconclusive findings on diagnostic imaging of breast: Secondary | ICD-10-CM

## 2017-09-02 ENCOUNTER — Ambulatory Visit
Admission: RE | Admit: 2017-09-02 | Discharge: 2017-09-02 | Disposition: A | Discharge status: Home / Self Care | Payer: Managed Care, Other (non HMO) | Source: Ambulatory Visit | Attending: Internal Medicine | Admitting: Internal Medicine

## 2017-09-02 ENCOUNTER — Other Ambulatory Visit: Payer: Self-pay | Admitting: Internal Medicine

## 2017-09-02 DIAGNOSIS — M25512 Pain in left shoulder: Secondary | ICD-10-CM

## 2017-09-02 DIAGNOSIS — M25511 Pain in right shoulder: Secondary | ICD-10-CM

## 2017-12-31 IMAGING — DX DG SHOULDER 2+V*R*
3 series · 3 of 3 positions shown · non-contrast
Comparison: Right shoulder series dated April 30, 2016

CLINICAL DATA: Chronic right shoulder pain with decreased range of
motion for many years. No known injury.

EXAM:
RIGHT SHOULDER - 2+ VIEW

[dg shoulder right (1 of 3)]
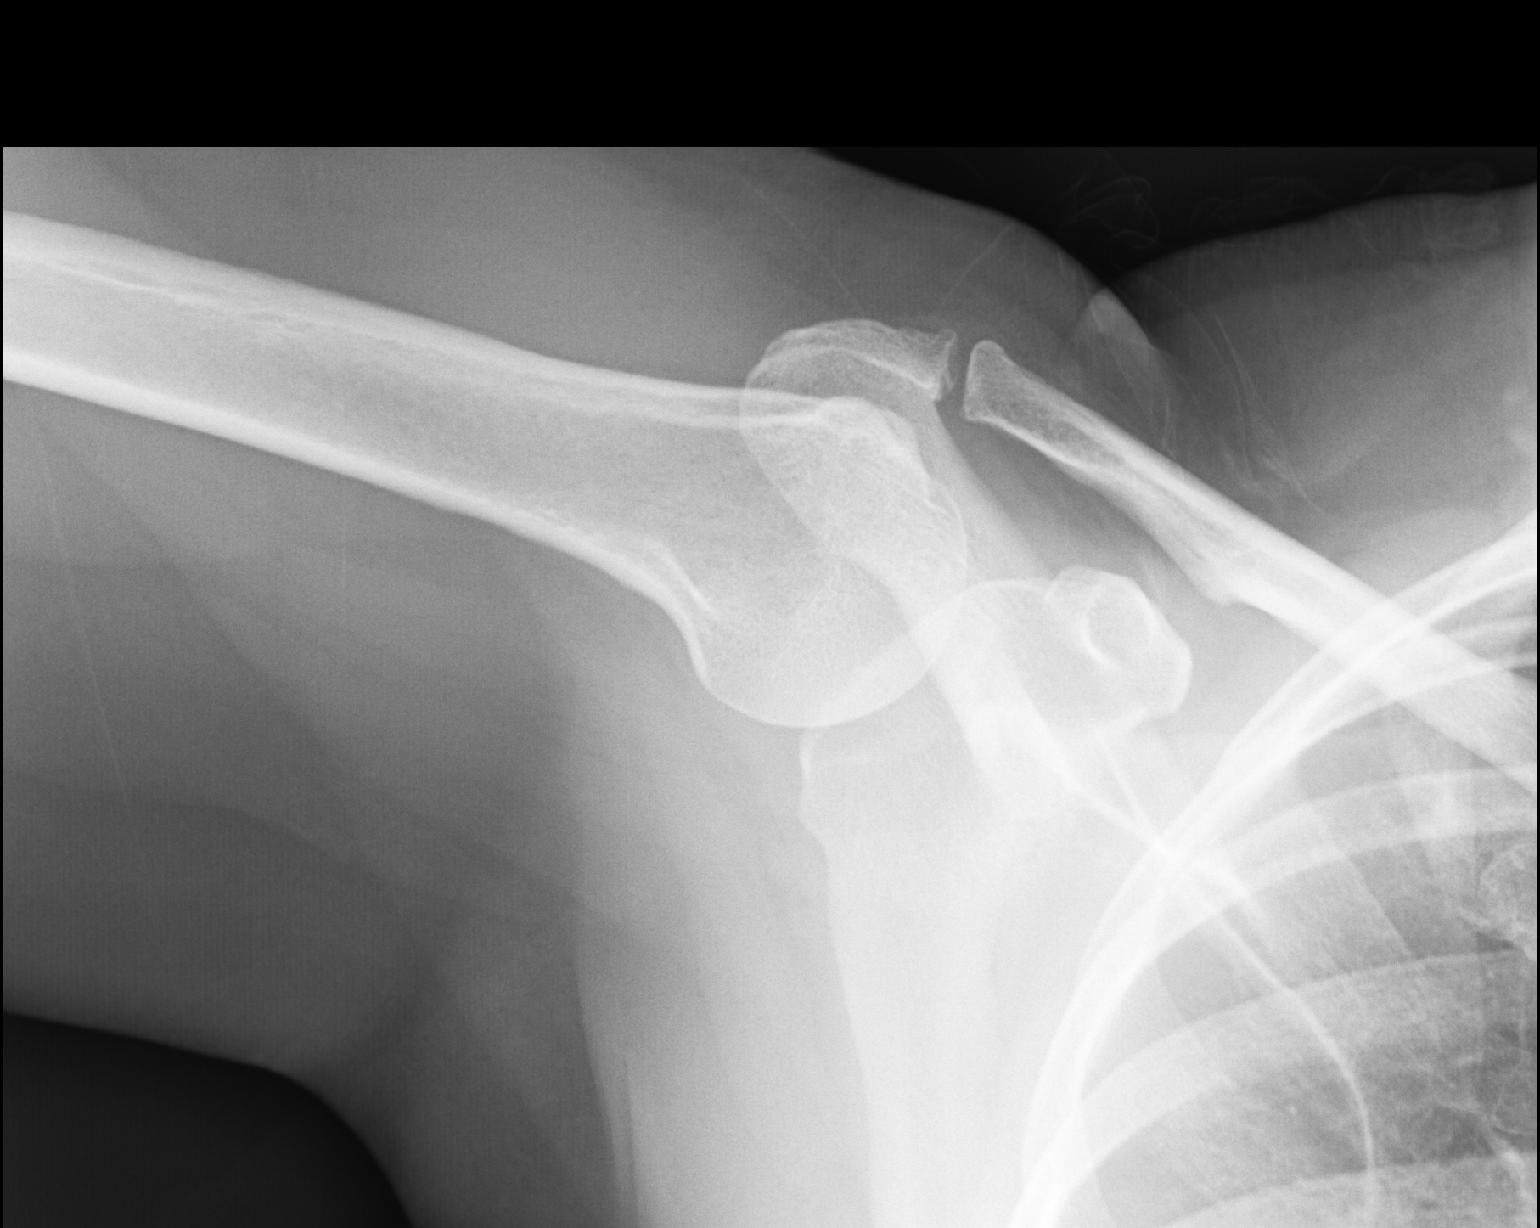

[dg shoulder right (2 of 3)]
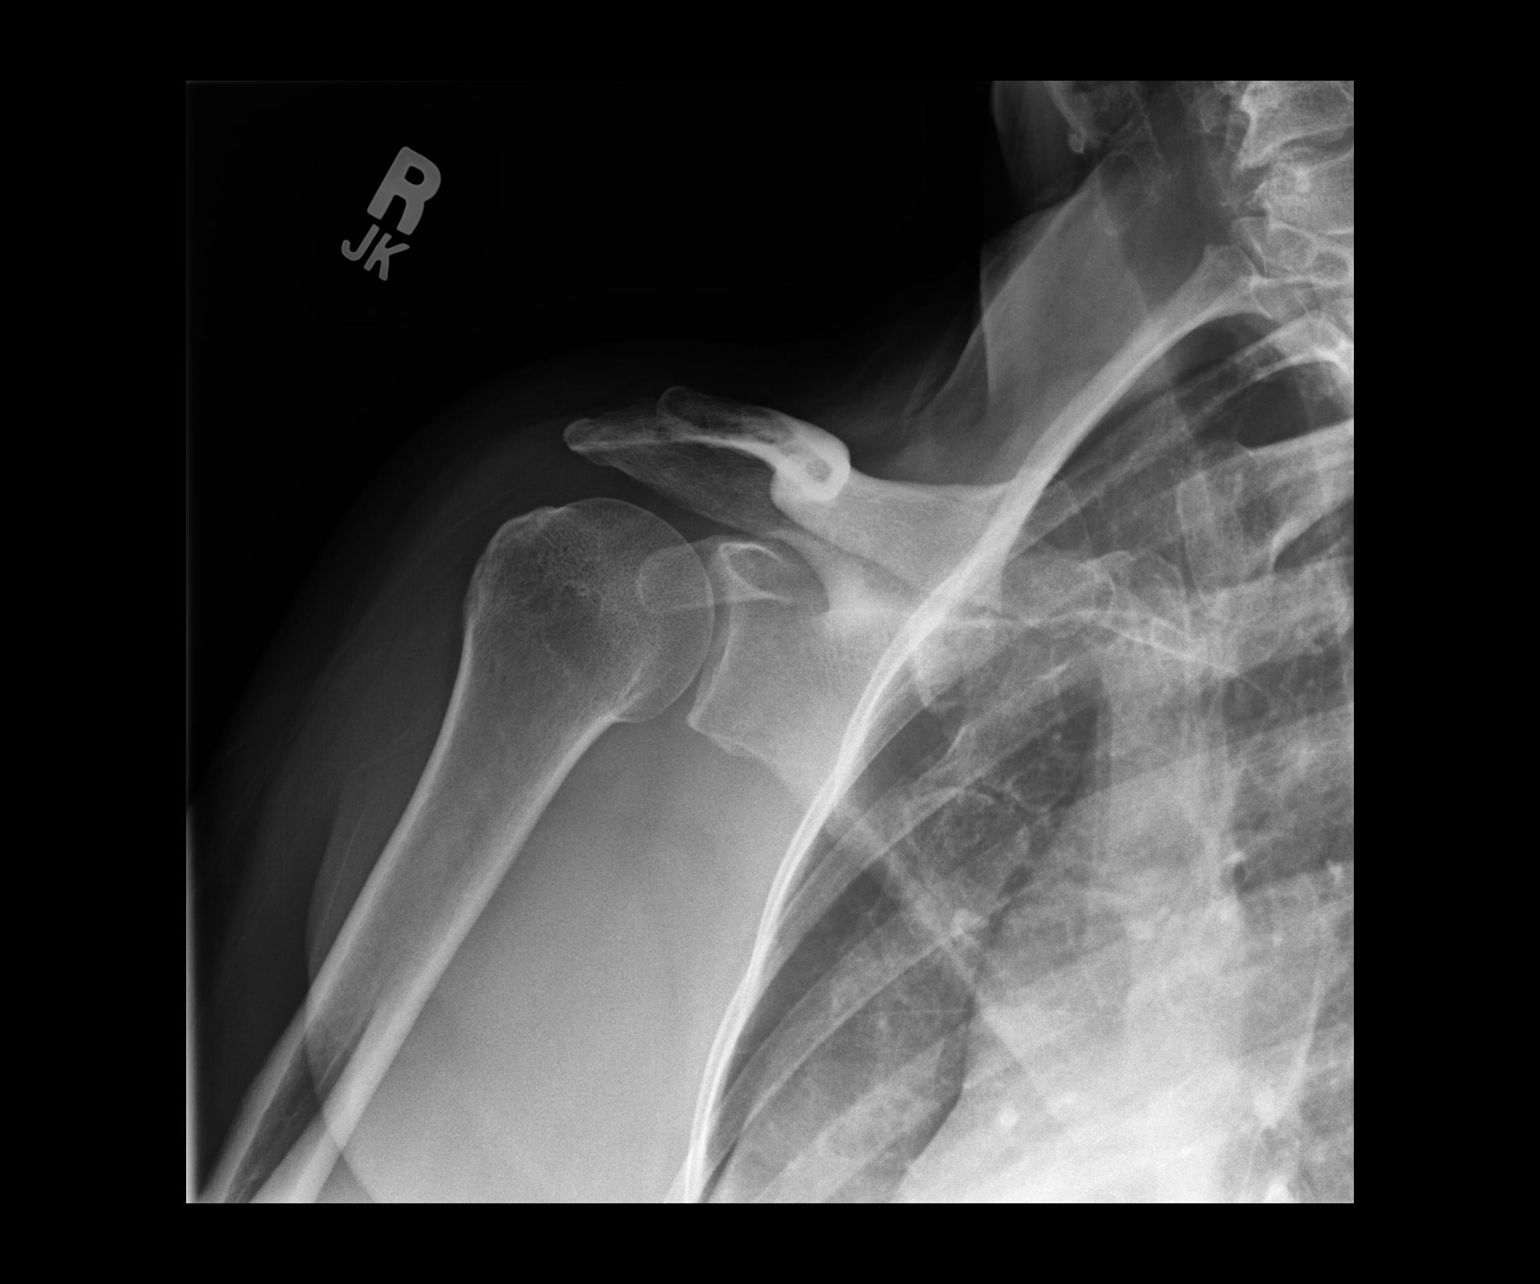

[dg shoulder right (3 of 3)]
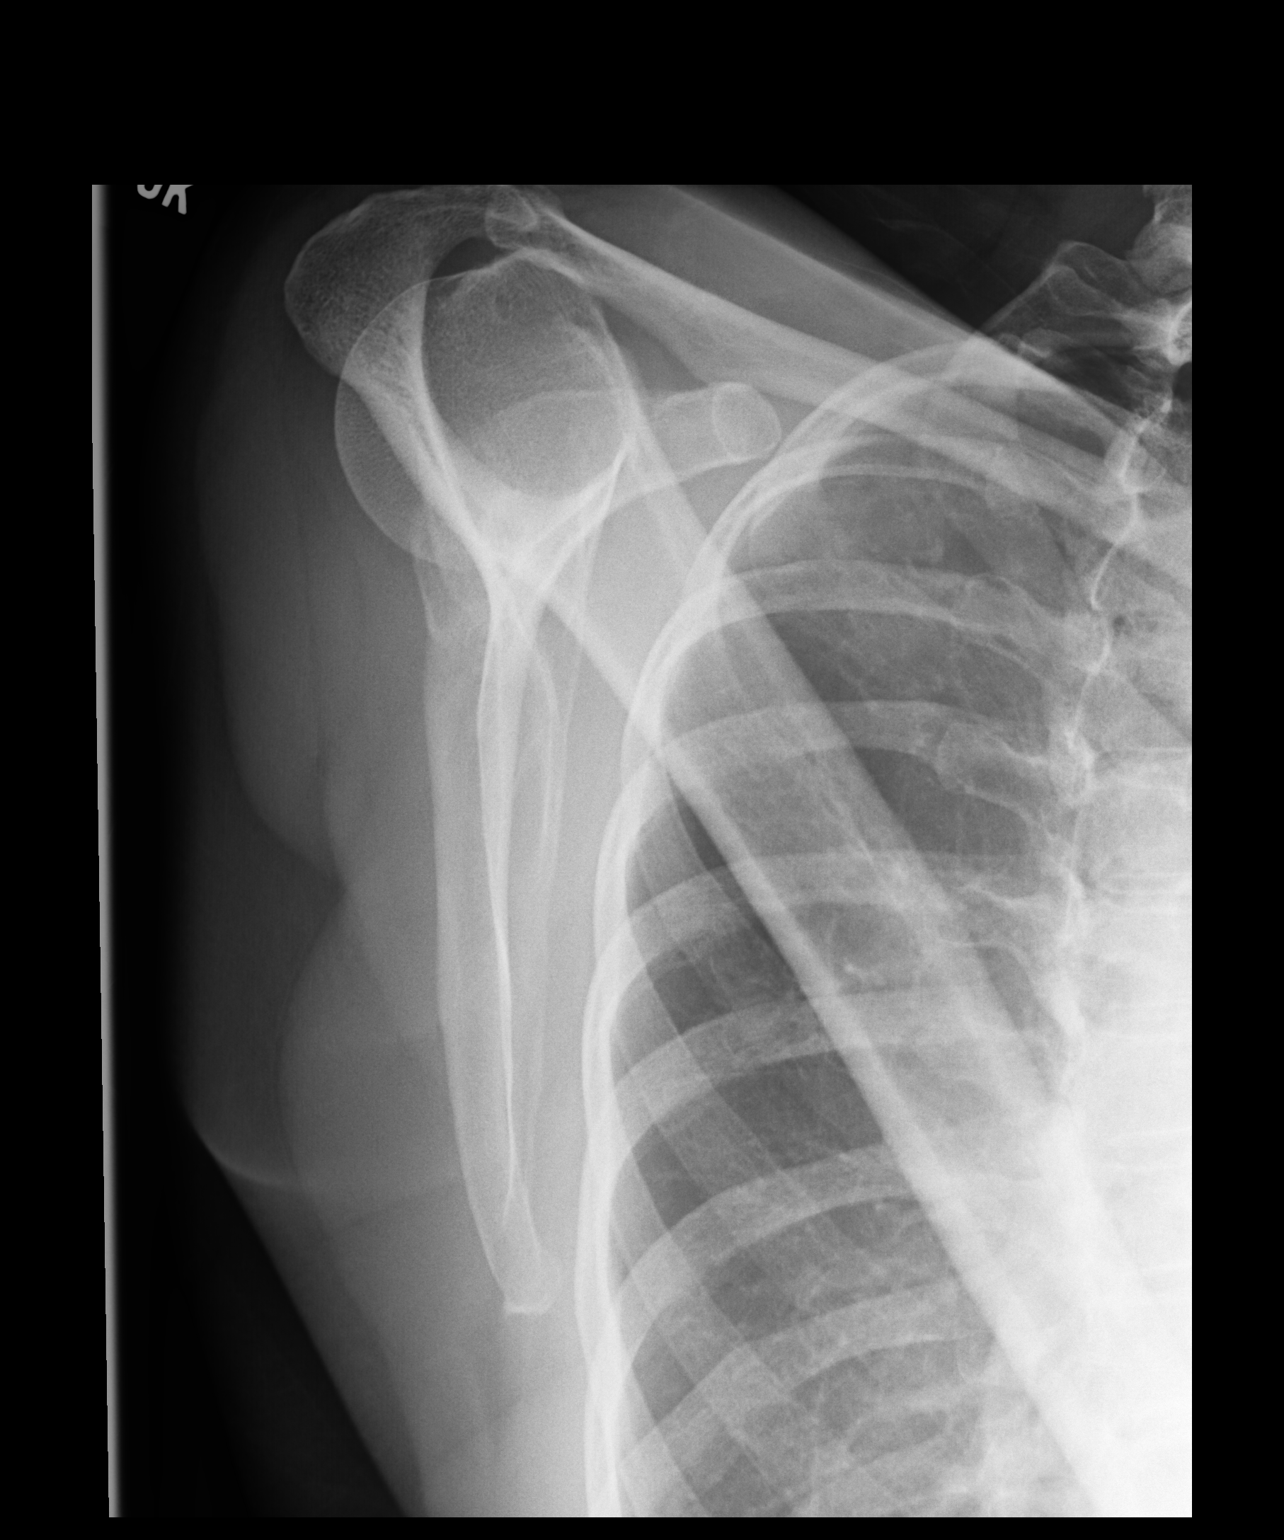

[3 of 3 positions shown; findings below may reference images not displayed]

FINDINGS: The bones are subjectively adequately mineralized. There is no acute
fracture nor dislocation. The AC joint space is well maintained as
is the glenohumeral joint space. The subacromial subdeltoid space is
also normal.
IMPRESSION: There is no acute or significant chronic bony abnormality of the
right shoulder.

## 2017-12-31 IMAGING — DX DG SHOULDER 2+V*L*
3 series · 3 of 3 positions shown · non-contrast
Comparison: None.

CLINICAL DATA: Chronic bilateral shoulder pain

EXAM:
LEFT SHOULDER - 2+ VIEW

[dg shoulder left (1 of 3)]
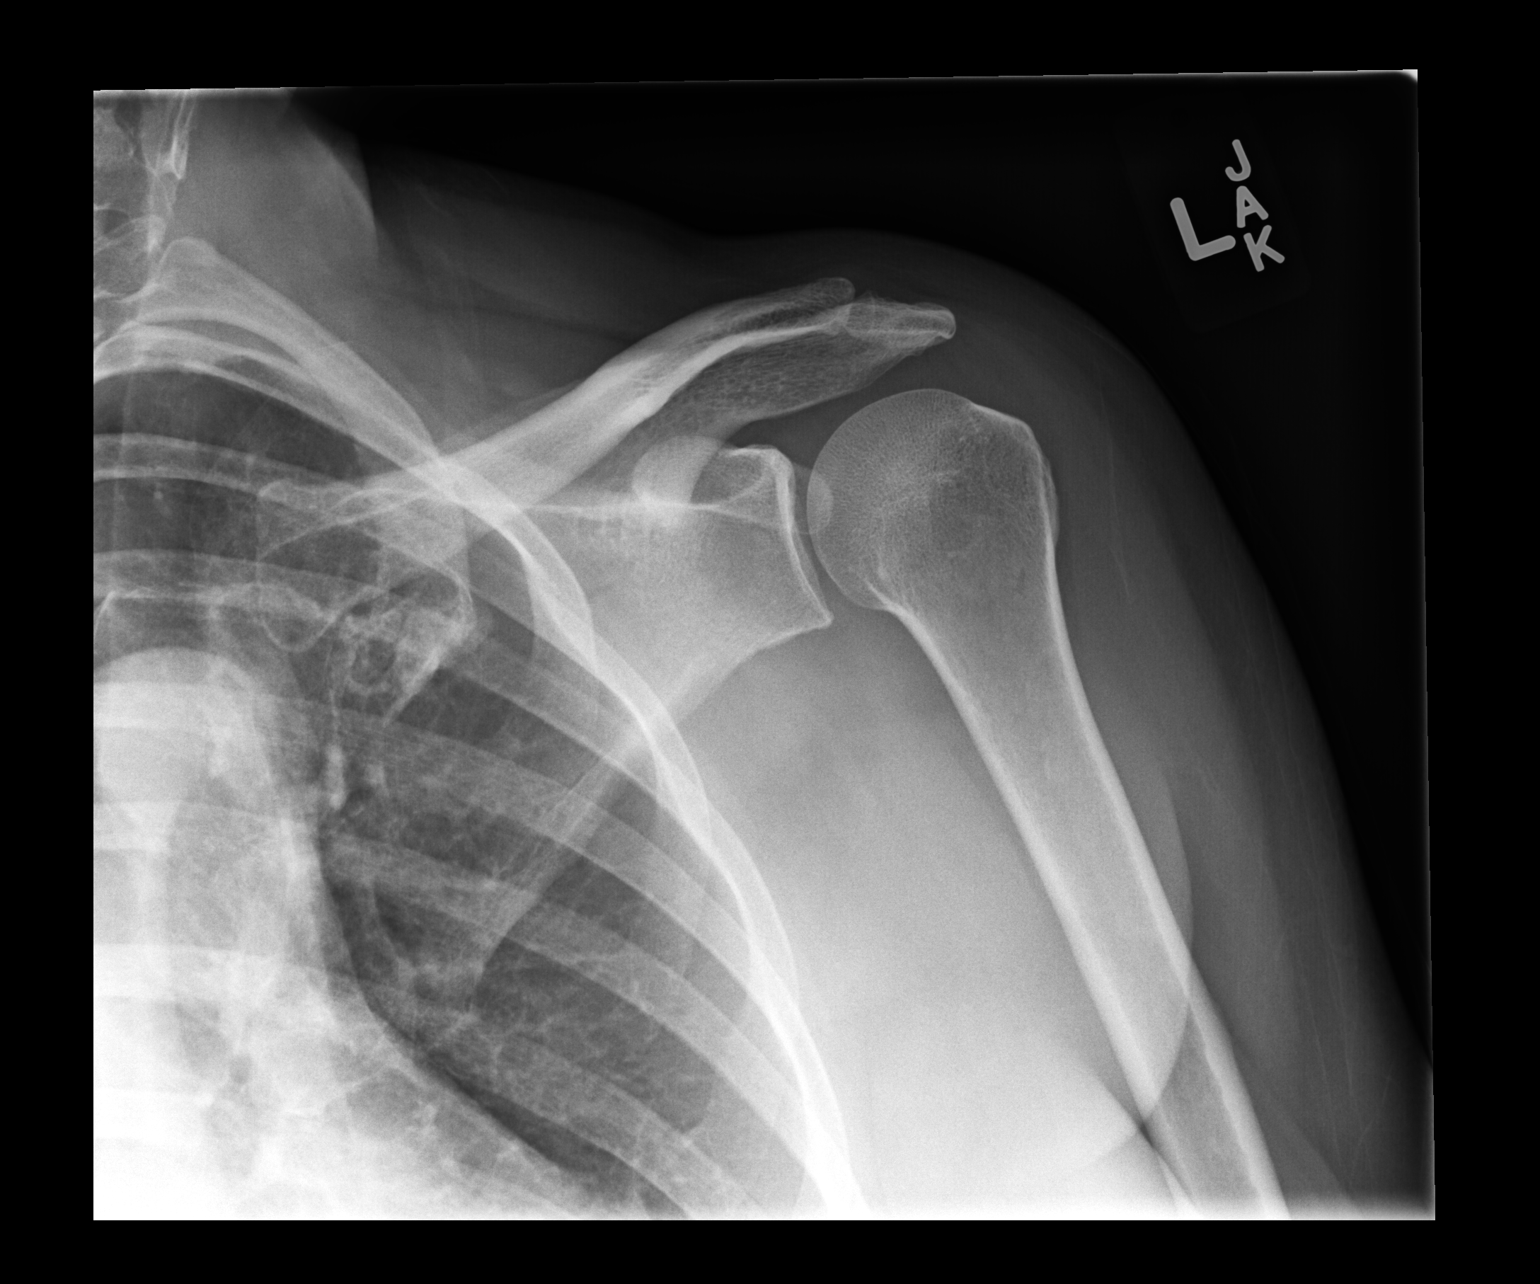

[dg shoulder left (2 of 3)]
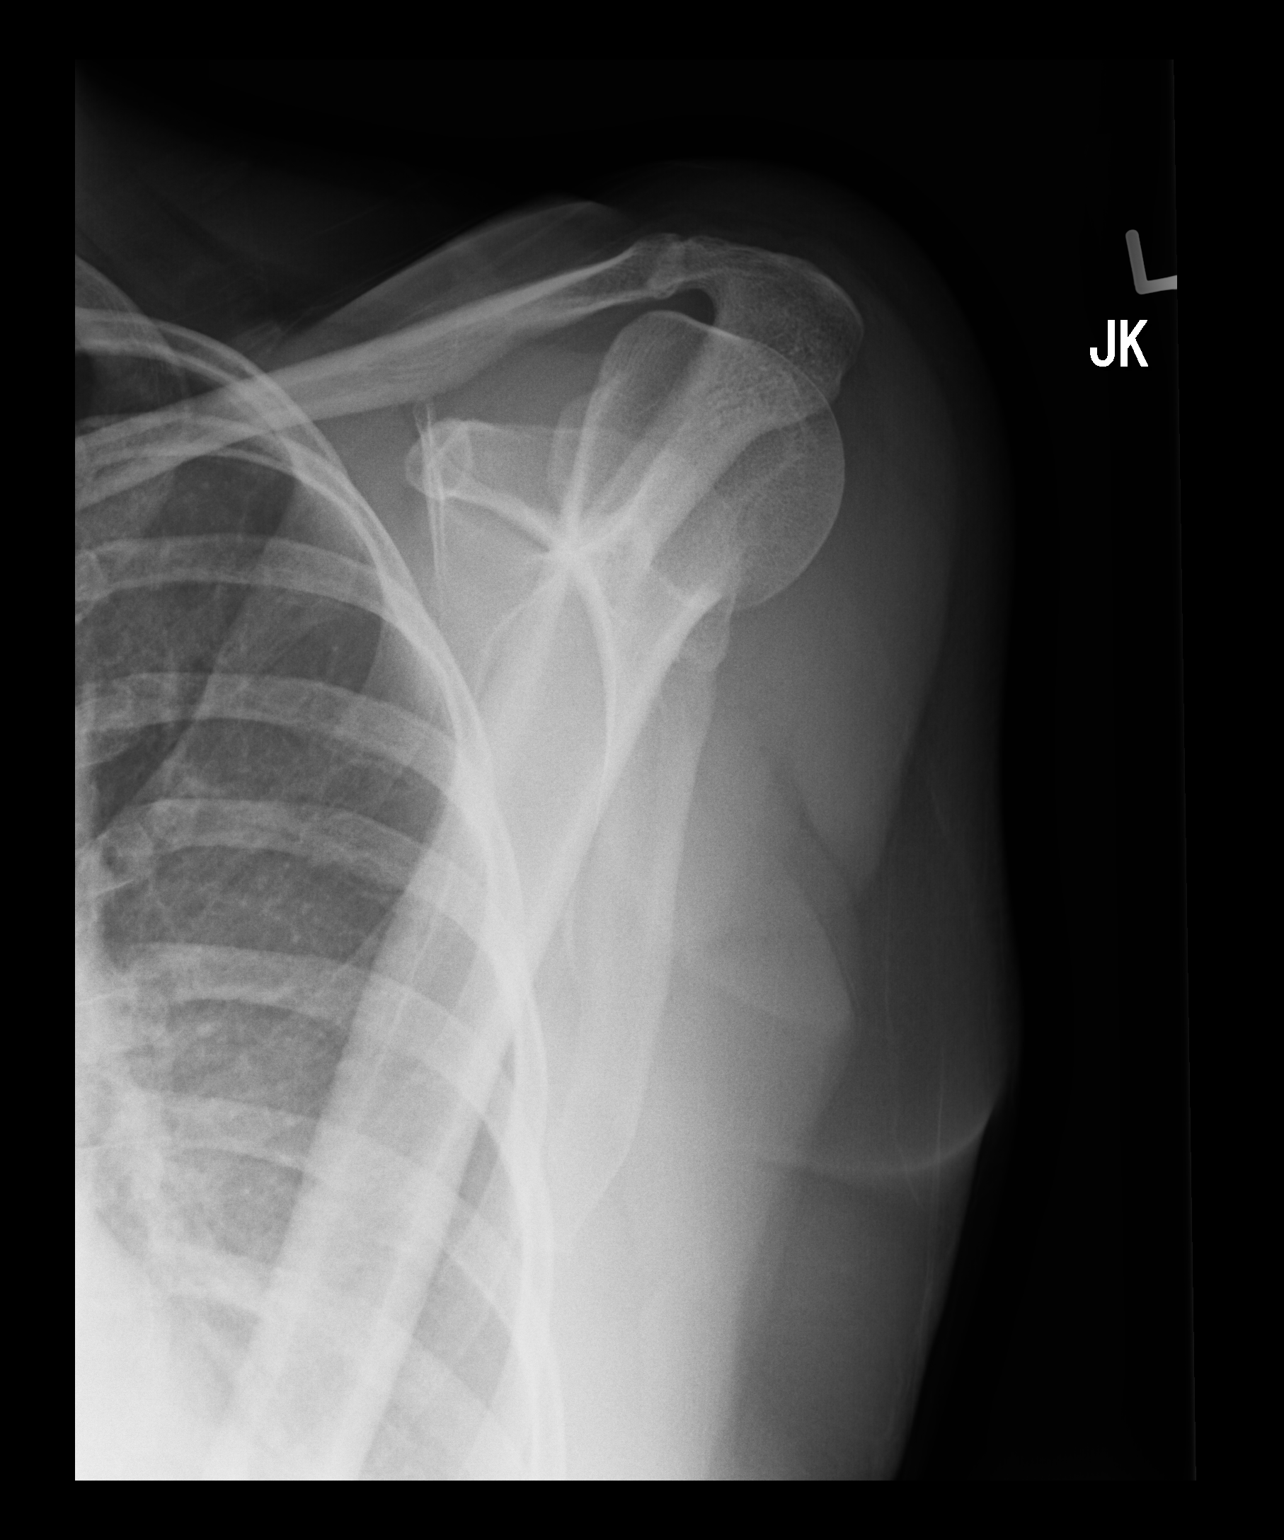

[dg shoulder left (3 of 3)]
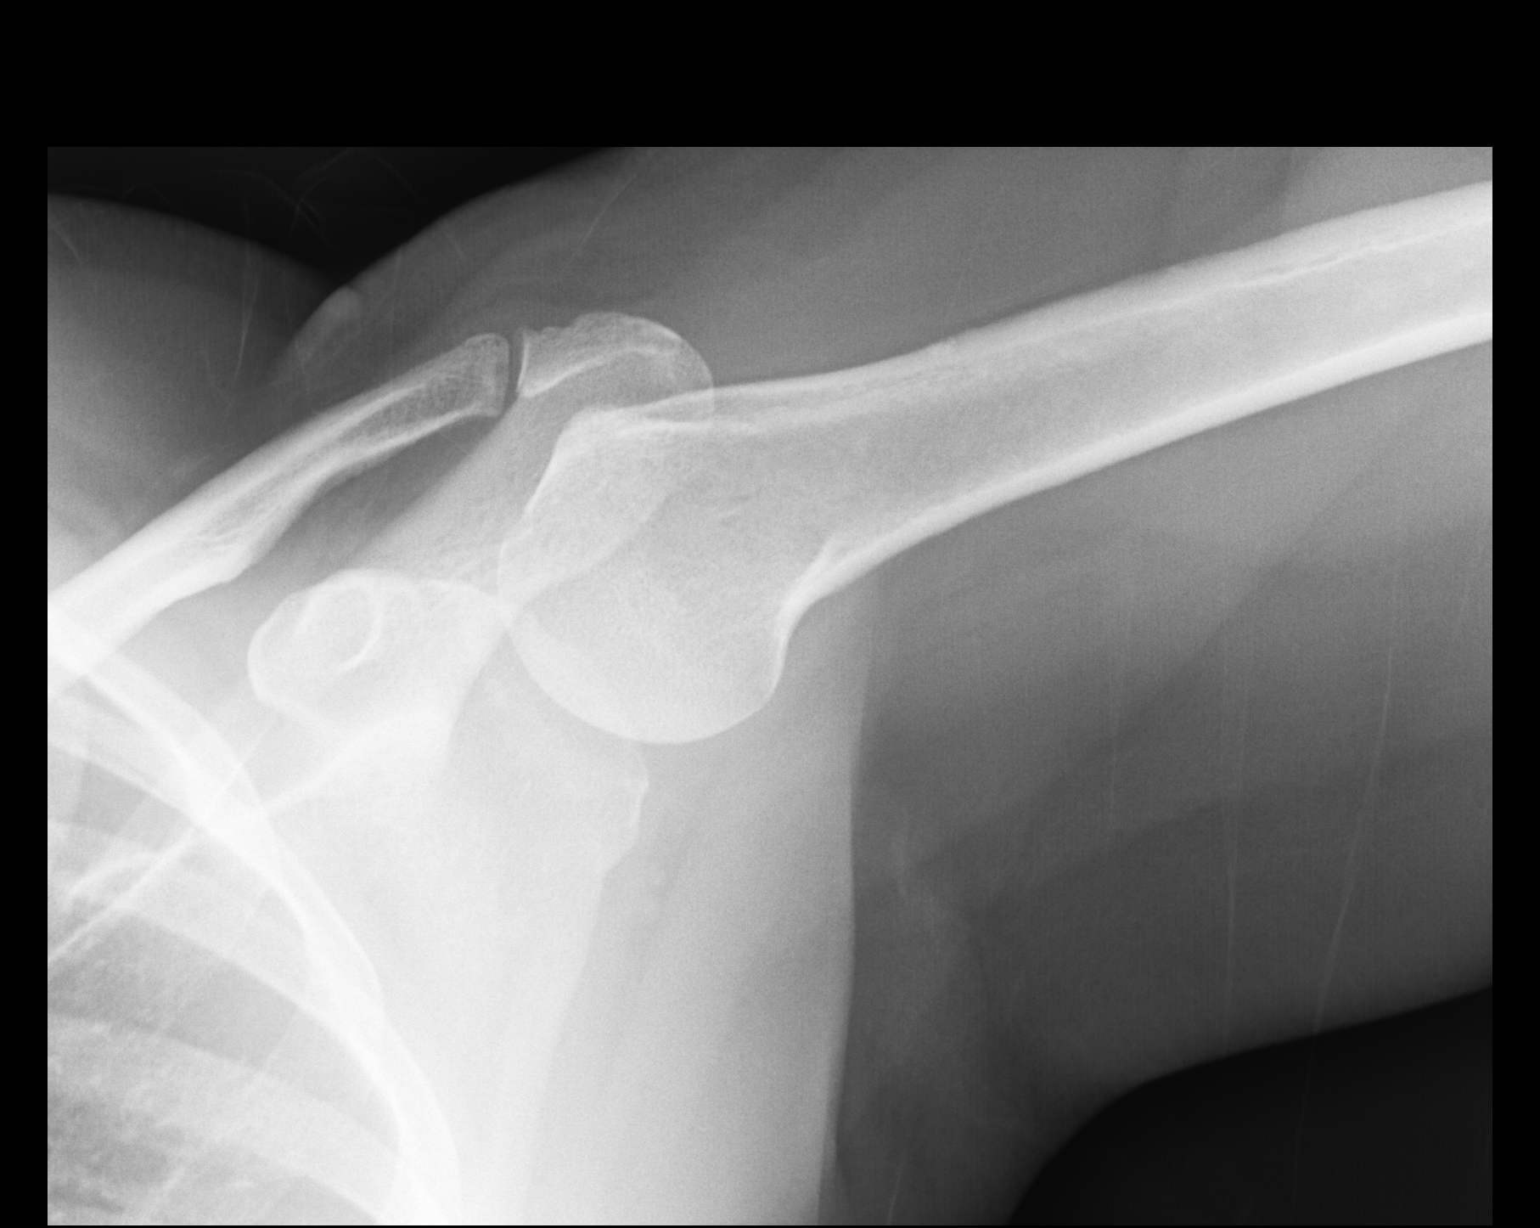

[3 of 3 positions shown; findings below may reference images not displayed]

FINDINGS: Early degenerative changes in the left AC joint. Glenohumeral joint
is maintained. No acute bony abnormality. Specifically, no fracture,
subluxation, or dislocation. Soft tissues are intact.
IMPRESSION: Early degenerative changes in the left AC joint. No acute bony
abnormality.

## 2018-07-11 ENCOUNTER — Other Ambulatory Visit: Payer: Self-pay | Admitting: Internal Medicine

## 2018-07-11 DIAGNOSIS — Z1231 Encounter for screening mammogram for malignant neoplasm of breast: Secondary | ICD-10-CM

## 2018-08-08 ENCOUNTER — Ambulatory Visit: Payer: Managed Care, Other (non HMO)

## 2019-08-25 ENCOUNTER — Other Ambulatory Visit: Payer: Self-pay | Admitting: Internal Medicine

## 2019-08-25 DIAGNOSIS — Z1231 Encounter for screening mammogram for malignant neoplasm of breast: Secondary | ICD-10-CM

## 2019-09-02 ENCOUNTER — Other Ambulatory Visit: Payer: Self-pay | Admitting: Internal Medicine

## 2019-09-02 DIAGNOSIS — N644 Mastodynia: Secondary | ICD-10-CM

## 2019-09-14 ENCOUNTER — Ambulatory Visit
Admission: RE | Admit: 2019-09-14 | Discharge: 2019-09-14 | Disposition: A | Discharge status: Home / Self Care | Payer: Managed Care, Other (non HMO) | Source: Ambulatory Visit | Attending: Internal Medicine | Admitting: Internal Medicine

## 2019-09-14 ENCOUNTER — Other Ambulatory Visit: Payer: Self-pay

## 2019-09-14 DIAGNOSIS — N644 Mastodynia: Secondary | ICD-10-CM

## 2020-07-18 ENCOUNTER — Ambulatory Visit: Payer: Self-pay

## 2020-07-18 ENCOUNTER — Other Ambulatory Visit: Payer: Self-pay

## 2020-07-18 ENCOUNTER — Ambulatory Visit (INDEPENDENT_AMBULATORY_CARE_PROVIDER_SITE_OTHER): Payer: Managed Care, Other (non HMO) | Admitting: Podiatry

## 2020-07-18 DIAGNOSIS — B351 Tinea unguium: Secondary | ICD-10-CM | POA: Diagnosis not present

## 2020-07-18 DIAGNOSIS — M21612 Bunion of left foot: Secondary | ICD-10-CM

## 2020-07-18 DIAGNOSIS — Z79899 Other long term (current) drug therapy: Secondary | ICD-10-CM

## 2020-07-18 DIAGNOSIS — M21611 Bunion of right foot: Secondary | ICD-10-CM

## 2020-07-18 NOTE — Patient Instructions (Addendum)
Terbinafine oral granules What is this medicine? TERBINAFINE (TER bin a feen) is an antifungal medicine. It is used to treat certain kinds of fungal or yeast infections. This medicine may be used for other purposes; ask your health care provider or pharmacist if you have questions. COMMON BRAND NAME(S): Lamisil What should I tell my health care provider before I take this medicine? They need to know if you have any of these conditions:  drink alcoholic beverages  kidney disease  liver disease  an unusual or allergic reaction to Terbinafine, other medicines, foods, dyes, or preservatives  pregnant or trying to get pregnant  breast-feeding How should I use this medicine? Take this medicine by mouth. Follow the directions on the prescription label. Hold packet with cut line on top. Shake packet gently to settle contents. Tear packet open along cut line, or use scissors to cut across line. Carefully pour the entire contents of packet onto a spoonful of a soft food, such as pudding or other soft, non-acidic food such as mashed potatoes (do NOT use applesauce or a fruit-based food). If two packets are required for each dose, you may either sprinkle the content of both packets on one spoonful of non-acidic food, or sprinkle the contents of both packets on two spoonfuls of non-acidic food. Make sure that no granules remain in the packet. Swallow the mxiture of the food and granules without chewing. Take your medicine at regular intervals. Do not take it more often than directed. Take all of your medicine as directed even if you think you are better. Do not skip doses or stop your medicine early. Contact your pediatrician or health care professional regarding the use of this medicine in children. While this medicine may be prescribed for children as young as 4 years for selected conditions, precautions do apply. Overdosage: If you think you have taken too much of this medicine contact a poison control  center or emergency room at once. NOTE: This medicine is only for you. Do not share this medicine with others. What if I miss a dose? If you miss a dose, take it as soon as you can. If it is almost time for your next dose, take only that dose. Do not take double or extra doses. What may interact with this medicine? Do not take this medicine with any of the following medications:  thioridazine This medicine may also interact with the following medications:  beta-blockers  caffeine  cimetidine  cyclosporine  MAOIs like Carbex, Eldepryl, Marplan, Nardil, and Parnate  medicines for fungal infections like fluconazole and ketoconazole  medicines for irregular heartbeat like amiodarone, flecainide and propafenone  rifampin  SSRIs like citalopram, escitalopram, fluoxetine, fluvoxamine, paroxetine and sertraline  tricyclic antidepressants like amitriptyline, clomipramine, desipramine, imipramine, nortriptyline, and others  warfarin This list may not describe all possible interactions. Give your health care provider a list of all the medicines, herbs, non-prescription drugs, or dietary supplements you use. Also tell them if you smoke, drink alcohol, or use illegal drugs. Some items may interact with your medicine. What should I watch for while using this medicine? Your doctor may monitor your liver function. Tell your doctor right away if you have nausea or vomiting, loss of appetite, stomach pain on your right upper side, yellow skin, dark urine, light stools, or are over tired. This medicine may cause serious skin reactions. They can happen weeks to months after starting the medicine. Contact your health care provider right away if you notice fevers or flu-like symptoms   with a rash. The rash may be red or purple and then turn into blisters or peeling of the skin. Or, you might notice a red rash with swelling of the face, lips or lymph nodes in your neck or under your arms. You need to take  this medicine for 6 weeks or longer to cure the fungal infection. Take your medicine regularly for as long as your doctor or health care provider tells you to. What side effects may I notice from receiving this medicine? Side effects that you should report to your doctor or health care professional as soon as possible:  allergic reactions like skin rash or hives, swelling of the face, lips, or tongue  change in vision  dark urine  fever or infection  general ill feeling or flu-like symptoms  light-colored stools  loss of appetite, nausea  rash, fever, and swollen lymph nodes  redness, blistering, peeling or loosening of the skin, including inside the mouth  right upper belly pain  unusually weak or tired  yellowing of the eyes or skin Side effects that usually do not require medical attention (report to your doctor or health care professional if they continue or are bothersome):  changes in taste  diarrhea  hair loss  muscle or joint pain  stomach upset This list may not describe all possible side effects. Call your doctor for medical advice about side effects. You may report side effects to FDA at 1-800-FDA-1088. Where should I keep my medicine? Keep out of the reach of children. Store at room temperature between 15 and 30 degrees C (59 and 86 degrees F). Throw away any unused medicine after the expiration date. NOTE: This sheet is a summary. It may not cover all possible information. If you have questions about this medicine, talk to your doctor, pharmacist, or health care provider.  2020 Elsevier/Gold Standard (2019-01-09 15:35:11)   Bunion  A bunion is a bump on the base of the big toe that forms when the bones of the big toe joint move out of position. Bunions may be small at first, but they often get larger over time. They can make walking painful. What are the causes? A bunion may be caused by:  Wearing narrow or pointed shoes that force the big toe to  press against the other toes.  Abnormal foot development that causes the foot to roll inward (pronate).  Changes in the foot that are caused by certain diseases, such as rheumatoid arthritis or polio.  A foot injury. What increases the risk? The following factors may make you more likely to develop this condition:  Wearing shoes that squeeze the toes together.  Having certain diseases, such as: ? Rheumatoid arthritis. ? Polio. ? Cerebral palsy.  Having family members who have bunions.  Being born with a foot deformity, such as flat feet or low arches.  Doing activities that put a lot of pressure on the feet, such as ballet dancing. What are the signs or symptoms? The main symptom of a bunion is a noticeable bump on the big toe. Other symptoms may include:  Pain.  Swelling around the big toe.  Redness and inflammation.  Thick or hardened skin on the big toe or between the toes.  Stiffness or loss of motion in the big toe.  Trouble with walking. How is this diagnosed? A bunion may be diagnosed based on your symptoms, medical history, and activities. You may have tests, such as:  X-rays. These allow your health care provider to  check the position of the bones in your foot and look for damage to your joint. They also help your health care provider determine the severity of your bunion and the best way to treat it.  Joint aspiration. In this test, a sample of fluid is removed from the toe joint. This test may be done if you are in a lot of pain. It helps rule out diseases that cause painful swelling of the joints, such as arthritis. How is this treated? Treatment depends on the severity of your symptoms. The goal of treatment is to relieve symptoms and prevent the bunion from getting worse. Your health care provider may recommend:  Wearing shoes that have a wide toe box.  Using bunion pads to cushion the affected area.  Taping your toes together to keep them in a normal  position.  Placing a device inside your shoe (orthotics) to help reduce pressure on your toe joint.  Taking medicine to ease pain, inflammation, and swelling.  Applying heat or ice to the affected area.  Doing stretching exercises.  Surgery to remove scar tissue and move the toes back into their normal position. This treatment is rare. Follow these instructions at home: Managing pain, stiffness, and swelling   If directed, put ice on the painful area: ? Put ice in a plastic bag. ? Place a towel between your skin and the bag. ? Leave the ice on for 20 minutes, 2-3 times a day. Activity   If directed, apply heat to the affected area before you exercise. Use the heat source that your health care provider recommends, such as a moist heat pack or a heating pad. ? Place a towel between your skin and the heat source. ? Leave the heat on for 20-30 minutes. ? Remove the heat if your skin turns bright red. This is especially important if you are unable to feel pain, heat, or cold. You may have a greater risk of getting burned.  Do exercises as told by your health care provider. General instructions  Support your toe joint with proper footwear, shoe padding, or taping as told by your health care provider.  Take over-the-counter and prescription medicines only as told by your health care provider.  Keep all follow-up visits as told by your health care provider. This is important. Contact a health care provider if your symptoms:  Get worse.  Do not improve in 2 weeks. Get help right away if you have:  Severe pain and trouble with walking. Summary  A bunion is a bump on the base of the big toe that forms when the bones of the big toe joint move out of position.  Bunions can make walking painful.  Treatment depends on the severity of your symptoms.  Support your toe joint with proper footwear, shoe padding, or taping as told by your health care provider. This information is not  intended to replace advice given to you by your health care provider. Make sure you discuss any questions you have with your health care provider. Document Revised: 04/07/2018 Document Reviewed: 02/11/2018 Elsevier Patient Education  2020 [employer].

## 2020-07-19 ENCOUNTER — Other Ambulatory Visit: Payer: Self-pay | Admitting: Podiatry

## 2020-07-19 LAB — CBC WITH DIFFERENTIAL/PLATELET
Absolute Monocytes: 418 cells/uL (ref 200–950)
Basophils Absolute: 45 cells/uL (ref 0–200)
Basophils Relative: 0.5 %
Eosinophils Absolute: 80 cells/uL (ref 15–500)
Eosinophils Relative: 0.9 %
HCT: 42.5 % (ref 35.0–45.0)
Hemoglobin: 13.7 g/dL (ref 11.7–15.5)
Lymphs Abs: 2492 cells/uL (ref 850–3900)
MCH: 26.2 pg — ABNORMAL LOW (ref 27.0–33.0)
MCHC: 32.2 g/dL (ref 32.0–36.0)
MCV: 81.3 fL (ref 80.0–100.0)
MPV: 11.1 fL (ref 7.5–12.5)
Monocytes Relative: 4.7 %
Neutro Abs: 5865 cells/uL (ref 1500–7800)
Neutrophils Relative %: 65.9 %
Platelets: 313 10*3/uL (ref 140–400)
RBC: 5.23 10*6/uL — ABNORMAL HIGH (ref 3.80–5.10)
RDW: 13.4 % (ref 11.0–15.0)
Total Lymphocyte: 28 %
WBC: 8.9 10*3/uL (ref 3.8–10.8)

## 2020-07-19 LAB — COMPLETE METABOLIC PANEL WITH GFR
AG Ratio: 1.6 (calc) (ref 1.0–2.5)
ALT: 18 U/L (ref 6–29)
AST: 18 U/L (ref 10–35)
Albumin: 4.7 g/dL (ref 3.6–5.1)
Alkaline phosphatase (APISO): 47 U/L (ref 37–153)
BUN: 15 mg/dL (ref 7–25)
CO2: 29 mmol/L (ref 20–32)
Calcium: 10.5 mg/dL — ABNORMAL HIGH (ref 8.6–10.4)
Chloride: 102 mmol/L (ref 98–110)
Creat: 0.67 mg/dL (ref 0.50–0.99)
GFR, Est African American: 108 mL/min/{1.73_m2} (ref >=60)
GFR, Est Non African American: 94 mL/min/{1.73_m2} (ref >=60)
Globulin: 3 g/dL (calc) (ref 1.9–3.7)
Glucose, Bld: 98 mg/dL (ref 65–99)
Potassium: 3.9 mmol/L (ref 3.5–5.3)
Sodium: 139 mmol/L (ref 135–146)
Total Bilirubin: 0.3 mg/dL (ref 0.2–1.2)
Total Protein: 7.7 g/dL (ref 6.1–8.1)

## 2020-07-19 MED ORDER — TERBINAFINE HCL 250 MG PO TABS
250.0000 mg | ORAL_TABLET | Freq: Every day | ORAL | 0 refills | Status: DC
Start: 2020-07-19 — End: 2020-11-02

## 2020-07-21 ENCOUNTER — Telehealth: Payer: Self-pay | Admitting: Podiatry

## 2020-07-21 NOTE — Telephone Encounter (Signed)
Patient called to return your earlier call.

## 2020-07-22 NOTE — Telephone Encounter (Signed)
Left VM for the patient to call back but also informed her on VM that labs normal and I had sent medication to the pharmacy

## 2020-07-25 DIAGNOSIS — B351 Tinea unguium: Secondary | ICD-10-CM | POA: Insufficient documentation

## 2020-07-25 DIAGNOSIS — M21611 Bunion of right foot: Secondary | ICD-10-CM | POA: Insufficient documentation

## 2020-07-25 DIAGNOSIS — M21612 Bunion of left foot: Secondary | ICD-10-CM | POA: Insufficient documentation

## 2020-07-25 NOTE — Progress Notes (Signed)
Subjective:   Patient ID: Terry [name], female   DOB: 64 y.o.   MRN: [id number]   HPI 64 year old female presents to the office today for nail fungus as well as for secondary concerns of bunions.  Regards to nail fungus she is tried over-the-counter treatment but the success.  No pain in the nails no redness or drainage or any signs of infection.  Incarcerated bunion she feels that she needs better support for her shoes.  Cause occasional discomfort.  No recent injury.  No other concerns today.   Review of Systems  All other systems reviewed and are negative.  Past Medical History:  Diagnosis Date  . Estrogen deficiency   . Hypertension   . Migraine     Past Surgical History:  Procedure Laterality Date  . PARTIAL HYSTERECTOMY       Current Outpatient Medications:  .  amitriptyline (ELAVIL) 10 MG tablet, Take 10 mg by mouth at bedtime., Disp: , Rfl:  .  amLODipine (NORVASC) 5 MG tablet, Take 5 mg by mouth daily., Disp: , Rfl:  .  diclofenac (VOLTAREN) 75 MG EC tablet, Take 75 mg by mouth 2 (two) times daily as needed., Disp: , Rfl:  .  estradiol (ESTRACE) 1 MG tablet, Take 1 mg by mouth daily., Disp: , Rfl:  .  imipramine (TOFRANIL) 25 MG tablet, Take 25 mg by mouth at bedtime., Disp: , Rfl:  .  lisinopril-hydrochlorothiazide (PRINZIDE,ZESTORETIC) 20-12.5 MG per tablet, Take 1 tablet by mouth daily., Disp: , Rfl:  .  metFORMIN (GLUCOPHAGE) 500 MG tablet, Take 500 mg by mouth 2 (two) times daily., Disp: , Rfl:  .  omeprazole (PRILOSEC) 40 MG capsule, Take 40 mg by mouth daily., Disp: , Rfl:  .  ondansetron (ZOFRAN-ODT) 8 MG disintegrating tablet, Take 1 tablet (8 mg total) by mouth every 8 (eight) hours as needed for nausea or vomiting., Disp: 10 tablet, Rfl: 0 .  terbinafine (LAMISIL) 250 MG tablet, Take 1 tablet (250 mg total) by mouth daily., Disp: 90 tablet, Rfl: 0  No Known Allergies        Objective:  Physical Exam  General: AAO x3, NAD  Dermatological: Nails  appear to be mildly hypertrophic, dystrophic with brown discoloration.  There is no drainage or pus or any pain to the toenail sites.  No open lesions.  Vascular: Dorsalis Pedis artery and Posterior Tibial artery pedal pulses are 2/4 bilateral with immedate capillary fill time.There is no pain with calf compression, swelling, warmth, erythema.   Neruologic: Grossly intact via light touch bilateral.   Musculoskeletal: Moderate bunion is present but there is no significant pain today.  There is no hypermobility of the first ray there is no pain or crepitation with first MPJ range of motion.  No significant discomfort today.  No areas of discomfort identified.  Muscular strength 5/5 in all groups tested bilateral.  Gait: Unassisted, Nonantalgic.       Assessment:   Onychomycosis, bunions     Plan:  -Treatment options discussed including all alternatives, risks, and complications -Etiology of symptoms were discussed  1.  Onychomycosis -Discussed treatment options we discussed both oral and topical.  After discussion she will proceed with oral Lamisil.  Ordered CBC LFT to check prior to starting Lamisil peer discussed side effects medications well success rates.  2.  Bunions -Feels that she is to have better support.  Power steps were dispensed we discussed shoe modifications.  Discussed offloading pads as well.  Consider surgical intervention if  symptoms continue.  If continue to have discomfort of the bunions will get x-rays  Vivi Barrack DPM

## 2020-08-19 ENCOUNTER — Other Ambulatory Visit: Payer: Self-pay | Admitting: Internal Medicine

## 2020-08-19 DIAGNOSIS — R928 Other abnormal and inconclusive findings on diagnostic imaging of breast: Secondary | ICD-10-CM

## 2020-08-29 ENCOUNTER — Ambulatory Visit (INDEPENDENT_AMBULATORY_CARE_PROVIDER_SITE_OTHER): Payer: Managed Care, Other (non HMO) | Admitting: Podiatry

## 2020-08-29 ENCOUNTER — Other Ambulatory Visit: Payer: Self-pay

## 2020-08-29 DIAGNOSIS — Z79899 Other long term (current) drug therapy: Secondary | ICD-10-CM

## 2020-08-29 DIAGNOSIS — M21611 Bunion of right foot: Secondary | ICD-10-CM | POA: Diagnosis not present

## 2020-08-29 DIAGNOSIS — M21612 Bunion of left foot: Secondary | ICD-10-CM | POA: Diagnosis not present

## 2020-08-29 DIAGNOSIS — B351 Tinea unguium: Secondary | ICD-10-CM | POA: Diagnosis not present

## 2020-08-29 MED ORDER — CICLOPIROX 8 % EX SOLN: Freq: Every day | CUTANEOUS | 4 refills | Status: DC

## 2020-08-29 NOTE — Patient Instructions (Signed)
Terbinafine oral granules What is this medicine? TERBINAFINE (TER bin a feen) is an antifungal medicine. It is used to treat certain kinds of fungal or yeast infections. This medicine may be used for other purposes; ask your health care provider or pharmacist if you have questions. COMMON BRAND NAME(S): Lamisil What should I tell my health care provider before I take this medicine? They need to know if you have any of these conditions:  drink alcoholic beverages  kidney disease  liver disease  an unusual or allergic reaction to Terbinafine, other medicines, foods, dyes, or preservatives  pregnant or trying to get pregnant  breast-feeding How should I use this medicine? Take this medicine by mouth. Follow the directions on the prescription label. Hold packet with cut line on top. Shake packet gently to settle contents. Tear packet open along cut line, or use scissors to cut across line. Carefully pour the entire contents of packet onto a spoonful of a soft food, such as pudding or other soft, non-acidic food such as mashed potatoes (do NOT use applesauce or a fruit-based food). If two packets are required for each dose, you may either sprinkle the content of both packets on one spoonful of non-acidic food, or sprinkle the contents of both packets on two spoonfuls of non-acidic food. Make sure that no granules remain in the packet. Swallow the mxiture of the food and granules without chewing. Take your medicine at regular intervals. Do not take it more often than directed. Take all of your medicine as directed even if you think you are better. Do not skip doses or stop your medicine early. Contact your pediatrician or health care professional regarding the use of this medicine in children. While this medicine may be prescribed for children as young as 4 years for selected conditions, precautions do apply. Overdosage: If you think you have taken too much of this medicine contact a poison control  center or emergency room at once. NOTE: This medicine is only for you. Do not share this medicine with others. What if I miss a dose? If you miss a dose, take it as soon as you can. If it is almost time for your next dose, take only that dose. Do not take double or extra doses. What may interact with this medicine? Do not take this medicine with any of the following medications:  thioridazine This medicine may also interact with the following medications:  beta-blockers  caffeine  cimetidine  cyclosporine  MAOIs like Carbex, Eldepryl, Marplan, Nardil, and Parnate  medicines for fungal infections like fluconazole and ketoconazole  medicines for irregular heartbeat like amiodarone, flecainide and propafenone  rifampin  SSRIs like citalopram, escitalopram, fluoxetine, fluvoxamine, paroxetine and sertraline  tricyclic antidepressants like amitriptyline, clomipramine, desipramine, imipramine, nortriptyline, and others  warfarin This list may not describe all possible interactions. Give your health care provider a list of all the medicines, herbs, non-prescription drugs, or dietary supplements you use. Also tell them if you smoke, drink alcohol, or use illegal drugs. Some items may interact with your medicine. What should I watch for while using this medicine? Your doctor may monitor your liver function. Tell your doctor right away if you have nausea or vomiting, loss of appetite, stomach pain on your right upper side, yellow skin, dark urine, light stools, or are over tired. This medicine may cause serious skin reactions. They can happen weeks to months after starting the medicine. Contact your health care provider right away if you notice fevers or flu-like symptoms   with a rash. The rash may be red or purple and then turn into blisters or peeling of the skin. Or, you might notice a red rash with swelling of the face, lips or lymph nodes in your neck or under your arms. You need to take  this medicine for 6 weeks or longer to cure the fungal infection. Take your medicine regularly for as long as your doctor or health care provider tells you to. What side effects may I notice from receiving this medicine? Side effects that you should report to your doctor or health care professional as soon as possible:  allergic reactions like skin rash or hives, swelling of the face, lips, or tongue  change in vision  dark urine  fever or infection  general ill feeling or flu-like symptoms  light-colored stools  loss of appetite, nausea  rash, fever, and swollen lymph nodes  redness, blistering, peeling or loosening of the skin, including inside the mouth  right upper belly pain  unusually weak or tired  yellowing of the eyes or skin Side effects that usually do not require medical attention (report to your doctor or health care professional if they continue or are bothersome):  changes in taste  diarrhea  hair loss  muscle or joint pain  stomach upset This list may not describe all possible side effects. Call your doctor for medical advice about side effects. You may report side effects to FDA at 1-800-FDA-1088. Where should I keep my medicine? Keep out of the reach of children. Store at room temperature between 15 and 30 degrees C (59 and 86 degrees F). Throw away any unused medicine after the expiration date. NOTE: This sheet is a summary. It may not cover all possible information. If you have questions about this medicine, talk to your doctor, pharmacist, or health care provider.  2020 Elsevier/Gold Standard (2019-01-09 15:35:11)  

## 2020-09-05 NOTE — Progress Notes (Signed)
Subjective: 64 year old female presents the office today for evaluation of nail fungus.  She is with the medication for the fungus has been helping.  She has been on Lamisil and side effects.  Also the inserts been helping for the foot pain, bunions.  No recent or falls or any changes otherwise and she has no other concerns today. Denies any systemic complaints such as fevers, chills, nausea, vomiting. No acute changes since last appointment, and no other complaints at this time.   Objective: AAO x3, NAD DP/PT pulses palpable bilaterally, CRT less than 3 seconds Bunions are unchanged there is no significant pain today to the bunions there is no other areas of tenderness to the feet.  The nails do appear to be growing out there is clear along the proximal nail fold.  There is no drainage or pus or ascending cellulitis.  No pain to the nails.  No open lesions. No pain with calf compression, swelling, warmth, erythema  Assessment: Onychomycosis, currently on Lamisil; bunions  Plan: -All treatment options discussed with the patient including all alternatives, risks, complications.  -Regards to nail fungus present finish the course of Lamisil.  We will recheck a CBC and LFT.  Also prescribed Penlac to use topically -Continue orthotics, shoe modifications with the bunions.  Offloading. -Patient encouraged to call the office with any questions, concerns, change in symptoms.   Vivi Barrack DPM

## 2020-10-13 ENCOUNTER — Other Ambulatory Visit: Payer: Managed Care, Other (non HMO)

## 2020-10-13 DIAGNOSIS — Z20822 Contact with and (suspected) exposure to covid-19: Secondary | ICD-10-CM

## 2020-10-15 LAB — NOVEL CORONAVIRUS, NAA: SARS-CoV-2, NAA: NOT DETECTED

## 2020-10-15 LAB — SARS-COV-2, NAA 2 DAY TAT

## 2020-10-31 DIAGNOSIS — R928 Other abnormal and inconclusive findings on diagnostic imaging of breast: Secondary | ICD-10-CM | POA: Insufficient documentation

## 2020-10-31 DIAGNOSIS — Z1211 Encounter for screening for malignant neoplasm of colon: Secondary | ICD-10-CM | POA: Insufficient documentation

## 2020-10-31 DIAGNOSIS — E739 Lactose intolerance, unspecified: Secondary | ICD-10-CM | POA: Insufficient documentation

## 2020-10-31 DIAGNOSIS — M21619 Bunion of unspecified foot: Secondary | ICD-10-CM | POA: Insufficient documentation

## 2020-10-31 DIAGNOSIS — I10 Essential (primary) hypertension: Secondary | ICD-10-CM | POA: Insufficient documentation

## 2020-10-31 DIAGNOSIS — K589 Irritable bowel syndrome without diarrhea: Secondary | ICD-10-CM | POA: Insufficient documentation

## 2020-10-31 DIAGNOSIS — Z860100 Personal history of colon polyps, unspecified: Secondary | ICD-10-CM | POA: Insufficient documentation

## 2020-10-31 DIAGNOSIS — M1990 Unspecified osteoarthritis, unspecified site: Secondary | ICD-10-CM | POA: Insufficient documentation

## 2020-10-31 DIAGNOSIS — K219 Gastro-esophageal reflux disease without esophagitis: Secondary | ICD-10-CM | POA: Insufficient documentation

## 2020-10-31 DIAGNOSIS — J309 Allergic rhinitis, unspecified: Secondary | ICD-10-CM | POA: Insufficient documentation

## 2020-10-31 DIAGNOSIS — N951 Menopausal and female climacteric states: Secondary | ICD-10-CM | POA: Insufficient documentation

## 2020-10-31 DIAGNOSIS — R7303 Prediabetes: Secondary | ICD-10-CM | POA: Insufficient documentation

## 2020-10-31 DIAGNOSIS — G4700 Insomnia, unspecified: Secondary | ICD-10-CM | POA: Insufficient documentation

## 2020-10-31 DIAGNOSIS — M159 Polyosteoarthritis, unspecified: Secondary | ICD-10-CM | POA: Insufficient documentation

## 2020-10-31 DIAGNOSIS — E1169 Type 2 diabetes mellitus with other specified complication: Secondary | ICD-10-CM | POA: Insufficient documentation

## 2020-10-31 DIAGNOSIS — G43909 Migraine, unspecified, not intractable, without status migrainosus: Secondary | ICD-10-CM | POA: Insufficient documentation

## 2020-10-31 DIAGNOSIS — M5090 Cervical disc disorder, unspecified, unspecified cervical region: Secondary | ICD-10-CM | POA: Insufficient documentation

## 2020-10-31 DIAGNOSIS — Z7901 Long term (current) use of anticoagulants: Secondary | ICD-10-CM | POA: Insufficient documentation

## 2020-11-01 ENCOUNTER — Ambulatory Visit: Payer: Managed Care, Other (non HMO) | Admitting: Family

## 2020-11-01 NOTE — Progress Notes (Addendum)
Virtual Visit via Telephone Note  I connected with Terry [name], on 11/02/2020 at 1:50 PM by telephone due to the COVID-19 pandemic and verified that I am speaking with the correct person using two identifiers.  Due to current restrictions/limitations of in-office visits due to the COVID-19 pandemic, this scheduled clinical appointment was converted to a telehealth visit.   Consent: I discussed the limitations, risks, security and privacy concerns of performing an evaluation and management service by telephone and the availability of in person appointments. I also discussed with the patient that there may be a patient responsible charge related to this service. The patient expressed understanding and agreed to proceed.   Location of Patient: Home  Location of Provider: [facility] at [facility]   Persons participating in Telemedicine visit: Donnie Mesa, NP Margorie John, CMA  History of Present Illness: Terry [name] is to establish care. Patient has significant PMH for type 2 diabetes mellitus with other specified complication, onychomycosis, gastroesophageal reflux disease, allergic rhinitis, and insomnia.  Current issues and/or concerns:  1. DIABETES TYPE 2:  Reports she was taking Metformin for diabetes. Reports she's not sure if she is still supposed to be taking Metformin. Also, states she ran out of medication December 2021 and as unable to pay for refills since then. Does not check blood sugars at home. Plans to get medical records released to this office soon for continuity of care.   ROS per HPI   Health Maintenance:  Health Maintenance Due  Topic Date Due  . HEMOGLOBIN A1C  Never done  . Hepatitis C Screening  Never done  . PNEUMOCOCCAL POLYSACCHARIDE VACCINE AGE 73-64 HIGH RISK  Never done  . FOOT EXAM  Never done  . OPHTHALMOLOGY EXAM  Never done  . HIV Screening  Never done  . COLONOSCOPY (Pts 45-8yrs Insurance coverage  will need to be confirmed)  Never done  . PAP SMEAR-Modifier  02/19/2020  . INFLUENZA VACCINE  05/15/2020      Past Medical History:  Diagnosis Date  . Estrogen deficiency   . Hypertension   . Migraine    Allergies  Allergen Reactions  . Codeine Nausea And Vomiting    Current Outpatient Medications on File Prior to Visit  Medication Sig Dispense Refill  . amitriptyline (ELAVIL) 10 MG tablet Take 10 mg by mouth at bedtime.    Marland Kitchen amLODipine (NORVASC) 5 MG tablet Take 5 mg by mouth daily.    . diclofenac (VOLTAREN) 75 MG EC tablet Take 75 mg by mouth 2 (two) times daily as needed.    Marland Kitchen lisinopril-hydrochlorothiazide (PRINZIDE,ZESTORETIC) 20-12.5 MG per tablet Take 1 tablet by mouth daily.    . metFORMIN (GLUCOPHAGE) 500 MG tablet Take 500 mg by mouth 2 (two) times daily.    . ondansetron (ZOFRAN-ODT) 8 MG disintegrating tablet Take 1 tablet (8 mg total) by mouth every 8 (eight) hours as needed for nausea or vomiting. 10 tablet 0   No current facility-administered medications on file prior to visit.    Observations/Objective: Alert and oriented x 3. Not in acute distress. Physical examination not completed as this is a telemedicine visit.  Assessment and Plan: 1. Encounter to establish care: - Patient presents today to establish care.  - Return in 4 to 6 weeks or sooner if needed for annual physical examination, labs, and health maintenance. Arrive fasting meaning having had no food and/or nothing to drink for at least 8 hours prior to appointment.  2. Type 2 diabetes mellitus with other specified complication, without long-term current use of insulin (HCC): - Reports she was taking Metformin for diabetes. Reports she's not sure if she is still supposed to be taking Metformin. Also, states she ran out of medication December 2021 and unable refill medications related to financial reasons. Does not check blood sugars at home. Plans to get medical records released to this office soon  for continuity of care. - Follow-up with primary provider in 1 week for annual physical examination and update on blood work. Patient agreeable. - Offered patient [place] financial discount/orange card and blue card application. Counseled patient will need to have an appointment with the financial counselor for processing of documentation. Patient agreeable.   Follow Up Instructions: Follow-up with primary provider in 1 week or sooner if needed.    Patient was given clear instructions to go to Emergency Department or return to medical center if symptoms don't improve, worsen, or new problems develop.The patient verbalized understanding.  I discussed the assessment and treatment plan with the patient. The patient was provided an opportunity to ask questions and all were answered. The patient agreed with the plan and demonstrated an understanding of the instructions.   The patient was advised to call back or seek an in-person evaluation if the symptoms worsen or if the condition fails to improve as anticipated.   I provided 10 minutes total of non-face-to-face time during this encounter including median intraservice time, reviewing previous notes, labs, imaging, medications, management and patient verbalized understanding.    Rema Fendt, NP  [facility] [place], [place] [phone] 11/02/2020, 4:01 PM

## 2020-11-02 ENCOUNTER — Other Ambulatory Visit: Payer: Self-pay

## 2020-11-02 ENCOUNTER — Telehealth (INDEPENDENT_AMBULATORY_CARE_PROVIDER_SITE_OTHER): Payer: Managed Care, Other (non HMO) | Admitting: Family

## 2020-11-02 ENCOUNTER — Encounter: Payer: Self-pay | Admitting: Family

## 2020-11-02 DIAGNOSIS — E1169 Type 2 diabetes mellitus with other specified complication: Secondary | ICD-10-CM

## 2020-11-02 DIAGNOSIS — Z7689 Persons encountering health services in other specified circumstances: Secondary | ICD-10-CM | POA: Diagnosis not present

## 2020-11-02 NOTE — Progress Notes (Signed)
Establish care

## 2020-11-06 NOTE — Progress Notes (Signed)
Patient ID: Terry [name], female    DOB: [birth year]  MRN: [id number]  CC: Annual Physical Examination   Subjective: Terry [name] is a 65 y.o. female who presents for annual physical examination.   1. HYPERTENSION FOLLOW-UP: Currently taking: see medication list Have you taken your blood pressure medication today: '[]'  Yes '[x]'  No  Med Adherence: '[]'  Yes    '[x]'  No, a couple days a week  Medication side effects: '[]'  Yes    '[x]'  No Adherence with salt restriction: '[x]'  Yes    '[]'  No Exercise: Yes '[]'  No '[x]'  Home Monitoring?: '[]'  Yes    '[x]'  No Monitoring Frequency: '[]'  Yes    '[x]'  No Home BP results range: '[]'  Yes    '[]'  No  Smoking '[]'  Yes '[x]'  No  SOB? '[]'  Yes    '[x]'  No Chest Pain?: '[]'  Yes    '[x]'  No Leg swelling?: '[]'  Yes    '[x]'  No Headaches?: '[x]'  Yes, Tylenol and it helps sometimes and taking migraine medication  Dizziness? '[]'  Yes    '[x]'  No Comments:   2. DIABETES TYPE 2: Last A1C:   6.0% on 11/07/2020 Med Adherence:  '[x]'  Yes    '[]'  No Medication side effects:  '[]'  Yes    '[x]'  No Home Monitoring?  '[]'  Yes    '[x]'  No Diet Adherence: '[]'  Yes    '[x]'  No Exercise: '[]'  Yes    '[x]'  No Numbness of the feet? '[]'  Yes    '[x]'  No Retinopathy hx? '[]'  Yes    '[x]'  No Last eye exam: Reports never had eye exam  3. TOENAIL FUNGUS: 07/18/2020: Visit with Podiatry Dr. Jacqualyn [name]. Patient seen for onychomycosis. Lamisil, CBC, and LFT ordered.   08/29/2020: Visit with Podiatry Dr. Jacqualyn [name]. Patient seen for onychomycosis. Continue with course of Lamisil, CBC, and LFT rechecked. Prescribed Penlac to use topically.   11/07/2020: Reports right great toenail fungus has improved but still present. Would like refill of medication.  Patient Active Problem List   Diagnosis Date Noted  . Abnormal mammogram 10/31/2020  . Allergic rhinitis 10/31/2020  . Cervical disc disease 10/31/2020  . Colon cancer screening 10/31/2020  . Gastroesophageal reflux disease 10/31/2020  . Hypercalcemia 10/31/2020  . Hypertension 10/31/2020  .  Insomnia 10/31/2020  . Irritable bowel syndrome 10/31/2020  . Lactose intolerance 10/31/2020  . Generalized osteoarthritis of hand 10/31/2020  . Long term (current) use of anticoagulants 10/31/2020  . Osteoarthritis 10/31/2020  . Migraine 10/31/2020  . Personal history of colonic polyps 10/31/2020  . Type 2 diabetes mellitus with other specified complication ([place]) 96/22/2979  . Bunion 10/31/2020  . Prediabetes 10/31/2020  . Menopausal flushing 10/31/2020  . Onychomycosis 07/25/2020  . Bilateral bunions 07/25/2020  . Estrogen deficiency      Current Outpatient Medications on File Prior to Visit  Medication Sig Dispense Refill  . amitriptyline (ELAVIL) 10 MG tablet Take 10 mg by mouth at bedtime.    . diclofenac (VOLTAREN) 75 MG EC tablet Take 75 mg by mouth 2 (two) times daily as needed.     No current facility-administered medications on file prior to visit.    Allergies  Allergen Reactions  . Codeine Nausea And Vomiting    Social History   Socioeconomic History  . Marital status: Single    Spouse name: Not on file  . Number of children: 2  . Years of education: 12th  . Highest education level: Not on file  Occupational History    Employer: [employer]  Tobacco Use  . Smoking status: Never Smoker  . Smokeless tobacco: Never Used  Vaping Use  . Vaping Use: Never used  Substance and Sexual Activity  . Alcohol use: Yes    Comment: socially  . Drug use: No  . Sexual activity: Not on file  Other Topics Concern  . Not on file  Social History Narrative   Pt lives at home with family.   Caffeine Use: 1-2 cups occasionally   Social Determinants of Health   Financial Resource Strain: Not on file  Food Insecurity: Not on file  Transportation Needs: Not on file  Physical Activity: Not on file  Stress: Not on file  Social Connections: Not on file  Intimate Partner Violence: Not on file    Family History  Problem Relation Age of Onset  . Dementia  Terry [name]   . Bladder Cancer Terry [name]   . Breast cancer Terry [name]     Past Surgical History:  Procedure Laterality Date  . PARTIAL HYSTERECTOMY      ROS: Review of Systems Negative except as stated above  PHYSICAL EXAM: BP 135/87   Pulse (!) 104   Ht '5\' 6"'  (1.676 m)   Wt 169 lb (76.7 kg)   SpO2 97%   BMI 27.28 kg/m   Physical Exam Exam conducted with a chaperone present.  HENT:     Head: Normocephalic and atraumatic.     Right Ear: Tympanic membrane, ear canal and external ear normal.     Left Ear: Tympanic membrane, ear canal and external ear normal.     Nose: Nose normal.     Mouth/Throat:     Mouth: Mucous membranes are moist.  Eyes:     Extraocular Movements: Extraocular movements intact.     Conjunctiva/sclera: Conjunctivae normal.     Pupils: Pupils are equal, round, and reactive to light.  Cardiovascular:     Rate and Rhythm: Tachycardia present.     Pulses: Normal pulses.     Heart sounds: Normal heart sounds.  Pulmonary:     Effort: Pulmonary effort is normal.     Breath sounds: Normal breath sounds.  Chest:  Breasts:     Right: Normal.     Left: Normal.      Comments: Terry [name], CMA present during physical examination. Musculoskeletal:        General: Normal range of motion.     Cervical back: Normal range of motion and neck supple.  Feet:     Right foot:     Toenail Condition: Fungal disease present.    Comments: Onychomycosis right great toe. Neurological:     General: No focal deficit present.     Mental Status: She is alert and oriented to person, place, and time.  Psychiatric:        Mood and Affect: Mood normal.        Behavior: Behavior normal.    Diabetic foot exam was performed with the following findings:   No deformities, ulcerations, or other skin breakdown Normal sensation of 10g monofilament Intact posterior tibialis and dorsalis pedis pulses     Results for orders placed or performed in visit on 11/07/20  POCT glycosylated  hemoglobin (Hb A1C)  Result Value Ref Range   Hemoglobin A1C 6.0 (A) 4.0 - 5.6 %   HbA1c POC (<> result, manual entry)     HbA1c, POC (prediabetic range)     HbA1c, POC (controlled diabetic range)      ASSESSMENT AND PLAN: 1. Annual physical  exam: - Counseled on 150 minutes of exercise per week as tolerated, healthy eating (including decreased daily intake of saturated fats, cholesterol, added sugars, sodium), STI prevention, and routine healthcare maintenance.  2. Essential hypertension: - Continue Amlodipine and Lisinopril-Hydrochlorothiazide as prescribed. - Counseled on blood pressure goal of less than 130/80, low-sodium, DASH diet, medication compliance, 150 minutes of moderate intensity exercise per week as tolerated. Discussed medication compliance, adverse effects. - amLODipine (NORVASC) 5 MG tablet; Take 1 tablet (5 mg total) by mouth daily.  Dispense: 90 tablet; Refill: 0 - lisinopril-hydrochlorothiazide (ZESTORETIC) 20-12.5 MG tablet; Take 1 tablet by mouth daily.  Dispense: 90 tablet; Refill: 0  3. Type 2 diabetes mellitus with other specified complication, without long-term current use of insulin ([place]): - Hemoglobin A1c at goal today at 6.0%, goal < 7%.  - Continue Metformin as prescribed.  - To achieve an A1C goal of less than or equal to 7.0 percent, a fasting blood sugar of 80 to 130 mg/dL and a postprandial glucose (90 to 120 minutes after a meal) less than 180 mg/dL. In the event of sugars less than 60 mg/dl or greater than 400 mg/dl please notify the clinic ASAP. It is recommended that you undergo annual eye exams and annual foot exams. - Discussed the importance of healthy eating habits, low-carbohydrate diet, low-sugar diet, regular aerobic exercise (at least 150 minutes a week as tolerated) and medication compliance to achieve or maintain control of diabetes. - Follow-up with primary provider in 3 months or sooner if needed. - POCT glycosylated hemoglobin (Hb A1C) -  metFORMIN (GLUCOPHAGE) 500 MG tablet; Take 1 tablet (500 mg total) by mouth 2 (two) times daily with a meal.  Dispense: 180 tablet; Refill: 0 - Blood Glucose Monitoring Suppl (TRUE METRIX METER) w/Device KIT; Use as directed  Dispense: 1 kit; Refill: 0 - TRUEplus Lancets 28G MISC; Use as directed  Dispense: 100 each; Refill: 4 - glucose blood (TRUE METRIX BLOOD GLUCOSE TEST) test strip; Use as instructed  Dispense: 100 each; Refill: 12  4. Onychomycosis: - Continue Ciclopirox and Terbinafine as prescribed. - ciclopirox (PENLAC) 8 % solution; Apply topically at bedtime. Apply over nail and surrounding skin. Apply daily over previous coat. After seven (7) days, may remove with alcohol and continue cycle.  Dispense: 6.6 mL; Refill: 0 - terbinafine (LAMISIL) 250 MG tablet; Take 1 tablet (250 mg total) by mouth daily.  Dispense: 30 tablet; Refill: 0  5. Encounter for screening for metabolic disorder: - CMP to check kidney function, liver function, and electrolyte balance. - Comprehensive metabolic panel  6. Screening cholesterol level: - Lipid panel to screen for high cholesterol. - Lipid Panel  7. Screening for deficiency anemia: - CBC to screen for anemia. - CBC  8. Thyroid disorder screen: - TSH to screen for thyroid function disorder. - TSH  9. Cervical cancer screening declined: - Declined.  - Last PAP 02/18/2017 negative for lesions, malignancy, and HPV.  10. Colon cancer screening declined: - Declined.  - Patient reports she had colonoscopy about 5 years ago. Asked patient to update provider once she has date, patient agreeable.  11. Need for hepatitis C screening test: - HCV to screen for hepatitis C. - HCV Ab w/Rflx to Verification  12. Encounter for screening for HIV: - HIV to screen for human immunodeficiency virus. - HIV antibody (with reflex)  13. Eye exam, routine: - Referral to Ophthalmology for routine eye examination. - Ambulatory referral to Ophthalmology  14.  Pneumococcal vaccine refused: - Declined.  15. Influenza vaccine administered: - Influenza vaccine administered during today's visit.  16. Financial difficulties: - Offered patient [place] financial discount/orange card and blue card application. Counseled patient will need to have an appointment with the financial counselor for processing of documentation. Patient agreeable.   Patient was given the opportunity to ask questions.  Patient verbalized understanding of the plan and was able to repeat key elements of the plan. Patient was given clear instructions to go to Emergency Department or return to medical center if symptoms don't improve, worsen, or new problems develop.The patient verbalized understanding.   Orders Placed This Encounter  Procedures  . Comprehensive metabolic panel  . CBC  . Lipid Panel  . TSH  . HCV Ab w/Rflx to Verification  . HIV antibody (with reflex)  . Ambulatory referral to Ophthalmology  . POCT glycosylated hemoglobin (Hb A1C)     Requested Prescriptions   Signed Prescriptions Disp Refills  . ciclopirox (PENLAC) 8 % solution 6.6 mL 0    Sig: Apply topically at bedtime. Apply over nail and surrounding skin. Apply daily over previous coat. After seven (7) days, may remove with alcohol and continue cycle.  . terbinafine (LAMISIL) 250 MG tablet 30 tablet 0    Sig: Take 1 tablet (250 mg total) by mouth daily.  Marland Kitchen amLODipine (NORVASC) 5 MG tablet 90 tablet 0    Sig: Take 1 tablet (5 mg total) by mouth daily.  Marland Kitchen lisinopril-hydrochlorothiazide (ZESTORETIC) 20-12.5 MG tablet 90 tablet 0    Sig: Take 1 tablet by mouth daily.  . metFORMIN (GLUCOPHAGE) 500 MG tablet 180 tablet 0    Sig: Take 1 tablet (500 mg total) by mouth 2 (two) times daily with a meal.  . Blood Glucose Monitoring Suppl (TRUE METRIX METER) w/Device KIT 1 kit 0    Sig: Use as directed  . TRUEplus Lancets 28G MISC 100 each 4    Sig: Use as directed  . glucose blood (TRUE METRIX BLOOD  GLUCOSE TEST) test strip 100 each 12    Sig: Use as instructed    Return in about 3 months (around 02/05/2021) for Durene Fruits, NP.  Camillia Herter, NP

## 2020-11-07 ENCOUNTER — Encounter: Payer: Self-pay | Admitting: Family

## 2020-11-07 ENCOUNTER — Telehealth: Payer: Self-pay

## 2020-11-07 ENCOUNTER — Other Ambulatory Visit: Payer: Self-pay

## 2020-11-07 ENCOUNTER — Other Ambulatory Visit: Payer: Self-pay | Admitting: Family

## 2020-11-07 ENCOUNTER — Ambulatory Visit (INDEPENDENT_AMBULATORY_CARE_PROVIDER_SITE_OTHER): Payer: Managed Care, Other (non HMO) | Admitting: Family

## 2020-11-07 VITALS — BP 135/87 | HR 104 | Ht 66.0 in | Wt 169.0 lb

## 2020-11-07 DIAGNOSIS — Z13220 Encounter for screening for lipoid disorders: Secondary | ICD-10-CM

## 2020-11-07 DIAGNOSIS — B351 Tinea unguium: Secondary | ICD-10-CM | POA: Diagnosis not present

## 2020-11-07 DIAGNOSIS — Z2821 Immunization not carried out because of patient refusal: Secondary | ICD-10-CM

## 2020-11-07 DIAGNOSIS — E1169 Type 2 diabetes mellitus with other specified complication: Secondary | ICD-10-CM | POA: Diagnosis not present

## 2020-11-07 DIAGNOSIS — Z1211 Encounter for screening for malignant neoplasm of colon: Secondary | ICD-10-CM

## 2020-11-07 DIAGNOSIS — Z0000 Encounter for general adult medical examination without abnormal findings: Secondary | ICD-10-CM | POA: Diagnosis not present

## 2020-11-07 DIAGNOSIS — Z124 Encounter for screening for malignant neoplasm of cervix: Secondary | ICD-10-CM

## 2020-11-07 DIAGNOSIS — Z599 Problem related to housing and economic circumstances, unspecified: Secondary | ICD-10-CM

## 2020-11-07 DIAGNOSIS — Z114 Encounter for screening for human immunodeficiency virus [HIV]: Secondary | ICD-10-CM

## 2020-11-07 DIAGNOSIS — I10 Essential (primary) hypertension: Secondary | ICD-10-CM | POA: Diagnosis not present

## 2020-11-07 DIAGNOSIS — Z0100 Encounter for examination of eyes and vision without abnormal findings: Secondary | ICD-10-CM

## 2020-11-07 DIAGNOSIS — Z1159 Encounter for screening for other viral diseases: Secondary | ICD-10-CM

## 2020-11-07 DIAGNOSIS — Z130 Encounter for screening for diseases of the blood and blood-forming organs and certain disorders involving the immune mechanism: Secondary | ICD-10-CM

## 2020-11-07 DIAGNOSIS — Z5320 Procedure and treatment not carried out because of patient's decision for unspecified reasons: Secondary | ICD-10-CM

## 2020-11-07 DIAGNOSIS — Z13228 Encounter for screening for other metabolic disorders: Secondary | ICD-10-CM

## 2020-11-07 DIAGNOSIS — Z23 Encounter for immunization: Secondary | ICD-10-CM

## 2020-11-07 DIAGNOSIS — Z1329 Encounter for screening for other suspected endocrine disorder: Secondary | ICD-10-CM

## 2020-11-07 LAB — POCT GLYCOSYLATED HEMOGLOBIN (HGB A1C): Hemoglobin A1C: 6 % — AB (ref 4.0–5.6)

## 2020-11-07 MED ORDER — TRUE METRIX METER W/DEVICE KIT: PACK | 0 refills | Status: DC

## 2020-11-07 MED ORDER — TERBINAFINE HCL 250 MG PO TABS
250.0000 mg | ORAL_TABLET | Freq: Every day | ORAL | 0 refills | Status: DC
Start: 2020-11-07 — End: 2020-11-07

## 2020-11-07 MED ORDER — METFORMIN HCL 500 MG PO TABS
500.0000 mg | ORAL_TABLET | Freq: Two times a day (BID) | ORAL | 0 refills | Status: DC
Start: 2020-11-07 — End: 2020-11-07

## 2020-11-07 MED ORDER — CICLOPIROX 8 % EX SOLN
Freq: Every day | CUTANEOUS | 0 refills | Status: DC
Start: 2020-11-07 — End: 2020-11-07

## 2020-11-07 MED ORDER — AMLODIPINE BESYLATE 5 MG PO TABS
5.0000 mg | ORAL_TABLET | Freq: Every day | ORAL | 0 refills | Status: DC
Start: 2020-11-07 — End: 2020-11-07

## 2020-11-07 MED ORDER — TRUEPLUS LANCETS 28G MISC: 4 refills | Status: AC

## 2020-11-07 MED ORDER — LISINOPRIL-HYDROCHLOROTHIAZIDE 20-12.5 MG PO TABS: 1.0000 | ORAL_TABLET | Freq: Every day | ORAL | 0 refills | Status: DC

## 2020-11-07 MED ORDER — TRUE METRIX BLOOD GLUCOSE TEST VI STRP: ORAL_STRIP | 12 refills | Status: DC

## 2020-11-07 MED ORDER — AMLODIPINE BESYLATE 5 MG PO TABS: 5.0000 mg | ORAL_TABLET | Freq: Every day | ORAL | 0 refills | Status: DC

## 2020-11-07 MED FILL — ONETOUCH DELICA PLUS LANCET: 30 days supply | Qty: 100 | Fill #0

## 2020-11-07 MED FILL — CICLOPIROX 8% SOLUTION: 8 | 30 days supply | Qty: 7 | Fill #0

## 2020-11-07 MED FILL — ONETOUCH VERIO REFLECT W/DE: W/DEVICE | 1 days supply | Qty: 1 | Fill #0

## 2020-11-07 MED FILL — LISINOPRIL-HCTZ 20-12.5 MG: 20-12.5 | 30 days supply | Qty: 30 | Fill #0

## 2020-11-07 MED FILL — ONETOUCH VERIO STRP: 30 days supply | Qty: 100 | Fill #0

## 2020-11-07 NOTE — Telephone Encounter (Signed)
Att to contact pt to confirm that meds were to be sent to [facility], due to pt expressed financial need.

## 2020-11-07 NOTE — Progress Notes (Signed)
Annual physical Have not being taking metformin since December

## 2020-11-07 NOTE — Patient Instructions (Addendum)
Physical exam and labs today.   Continue Amlodipine and Lisinopril-Hydrochlorothiazide for high blood pressure.   Continue Metformin for diabetes.  Lamisil and Penlac for right foot nail fungus.  Flu vaccine today.  Referral for annual eye examination.  Follow-up with primary provider in 3 months or sooner if needed.  Diabetes Mellitus Action Plan Following a diabetes action plan is a way for you to manage your diabetes (diabetes mellitus) symptoms. The plan is color-coded to help you understand what actions you need to take based on any symptoms you are having.  If you have symptoms in the red zone, you need medical care right away.  If you have symptoms in the yellow zone, you are having problems.  If you have symptoms in the green zone, you are doing well. Learning about and understanding diabetes can take time. Follow the plan that you develop with your health care provider. Know the target range for your blood sugar (glucose) level, and review your treatment plan with your health care provider at each visit. The target range for my blood sugar level is __________________________ mg/dL. Red zone Get medical help right away if you have any of the following symptoms:  A blood sugar test result that is below 54 mg/dL (3 mmol/L).  A blood sugar test result that is at or above 240 mg/dL (69.6 mmol/L) for 2 days in a row.  Confusion or trouble thinking clearly.  Difficulty breathing.  Sickness or a fever for 2 or more days that is not getting better.  Moderate or large ketone levels in your urine.  Feeling tired or having no energy. If you have any red zone symptoms, do not wait to see if the symptoms will go away. Get medical help right away. Call your local emergency services (911 in the [place] Do not drive yourself to the hospital. If you have severely low blood sugar (severe hypoglycemia) and you cannot eat or drink, you may need glucagon. Make sure a family member or  close friend knows how to check your blood sugar and how to give you glucagon. You may need to be treated in a hospital for this condition.   Yellow zone If you have any of the following symptoms, your diabetes is not under control and you may need to make some changes:  A blood sugar test result that is at or above 240 mg/dL (78.9 mmol/L) for 2 days in a row.  Blood sugar test results that are below 70 mg/dL (3.9 mmol/L).  Other symptoms of hypoglycemia, such as: ? Shaking or feeling light-headed. ? Confusion or irritability. ? Feeling hungry. ? Having a fast heartbeat. If you have any yellow zone symptoms:  Treat your hypoglycemia by eating or drinking 15 grams of a rapid-acting carbohydrate. Follow the 15:15 rule: ? Take 15 grams of a rapid-acting carbohydrate, such as:  1 tube of glucose gel.  4 glucose pills.  4 oz (120 mL) of fruit juice.  4 oz (120 mL) of regular (not diet) soda. ? Check your blood sugar 15 minutes after you take the carbohydrate. ? If the repeat blood sugar test is still at or below 70 mg/dL (3.9 mmol/L), take 15 grams of a carbohydrate again. ? If your blood sugar does not increase above 70 mg/dL (3.9 mmol/L) after 3 tries, get medical help right away. ? After your blood sugar returns to normal, eat a meal or a snack within 1 hour.  Keep taking your daily medicines as told by your  health care provider.  Check your blood sugar more often than you normally would. ? Write down your results. ? Call your health care provider if you have trouble keeping your blood sugar in your target range.   Green zone These signs mean you are doing well and you can continue what you are doing to manage your diabetes:  Your blood sugar is within your personal target range. For most people, a blood sugar level before a meal (preprandial) should be 80-130 mg/dL (5.6-3.8 mmol/L).  You feel well, and you are able to do daily activities. If you are in the green zone, continue  to manage your diabetes as told by your health care provider. To do this:  Eat a healthy diet.  Exercise regularly.  Check your blood sugar as told by your health care provider.  Take your medicines as told by your health care provider.   Where to find more information  American Diabetes Association (ADA): diabetes.org  Association of Diabetes Care & Education Specialists (ADCES): diabeteseducator.org Summary  Following a diabetes action plan is a way for you to manage your diabetes symptoms. The plan is color-coded to help you understand what actions you need to take based on any symptoms you are having.  Follow the plan that you develop with your health care provider. Make sure you know your personal target blood sugar level.  Review your treatment plan with your health care provider at each visit. This information is not intended to replace advice given to you by your health care provider. Make sure you discuss any questions you have with your health care provider. Document Revised: 04/07/2020 Document Reviewed: 04/07/2020 Elsevier Patient Education  2021 [employer].

## 2020-11-08 LAB — COMPREHENSIVE METABOLIC PANEL
ALT: 23 IU/L (ref 0–32)
AST: 22 IU/L (ref 0–40)
Albumin/Globulin Ratio: 1.5 (ref 1.2–2.2)
Albumin: 4.6 g/dL (ref 3.8–4.8)
Alkaline Phosphatase: 47 IU/L (ref 44–121)
BUN/Creatinine Ratio: 16 (ref 12–28)
BUN: 13 mg/dL (ref 8–27)
Bilirubin Total: 0.3 mg/dL (ref 0.0–1.2)
CO2: 23 mmol/L (ref 20–29)
Calcium: 10 mg/dL (ref 8.7–10.3)
Chloride: 102 mmol/L (ref 96–106)
Creatinine, Ser: 0.81 mg/dL (ref 0.57–1.00)
GFR calc Af Amer: 89 mL/min/{1.73_m2} (ref >59)
GFR calc non Af Amer: 78 mL/min/{1.73_m2} (ref >59)
Globulin, Total: 3 g/dL (ref 1.5–4.5)
Glucose: 101 mg/dL — ABNORMAL HIGH (ref 65–99)
Potassium: 4 mmol/L (ref 3.5–5.2)
Sodium: 141 mmol/L (ref 134–144)
Total Protein: 7.6 g/dL (ref 6.0–8.5)

## 2020-11-08 LAB — CBC
Hematocrit: 44.3 % (ref 34.0–46.6)
Hemoglobin: 14 g/dL (ref 11.1–15.9)
MCH: 26.7 pg (ref 26.6–33.0)
MCHC: 31.6 g/dL (ref 31.5–35.7)
MCV: 84 fL (ref 79–97)
Platelets: 227 10*3/uL (ref 150–450)
RBC: 5.25 x10E6/uL (ref 3.77–5.28)
RDW: 13.1 % (ref 11.7–15.4)
WBC: 10 10*3/uL (ref 3.4–10.8)

## 2020-11-08 LAB — LIPID PANEL
Chol/HDL Ratio: 4.2 ratio (ref 0.0–4.4)
Cholesterol, Total: 224 mg/dL — ABNORMAL HIGH (ref 100–199)
HDL: 53 mg/dL (ref >39)
LDL Chol Calc (NIH): 144 mg/dL — ABNORMAL HIGH (ref 0–99)
Triglycerides: 150 mg/dL — ABNORMAL HIGH (ref 0–149)
VLDL Cholesterol Cal: 27 mg/dL (ref 5–40)

## 2020-11-08 LAB — HCV AB W/RFLX TO VERIFICATION: HCV Ab: <0.1 s/co ratio (ref 0.0–0.9)

## 2020-11-08 LAB — TSH: TSH: 1.45 u[IU]/mL (ref 0.450–4.500)

## 2020-11-08 LAB — HCV INTERPRETATION

## 2020-11-08 LAB — HIV ANTIBODY (ROUTINE TESTING W REFLEX): HIV Screen 4th Generation wRfx: NONREACTIVE

## 2020-11-11 ENCOUNTER — Telehealth: Payer: Self-pay | Admitting: Internal Medicine

## 2020-11-11 NOTE — Telephone Encounter (Signed)
Pt called in stating she did not receive the Metformin medication refilled on 11/07/20 because the pharmacy told her they didn't have that sent in. Please advise and thank you

## 2020-11-28 ENCOUNTER — Telehealth: Payer: Self-pay | Admitting: Family

## 2020-11-28 NOTE — Telephone Encounter (Signed)
Patient called to schedule a CAFA appt. After call I realized patient has insurance and returned call to patient. Patient states she does not have insurance. Please f/u

## 2020-11-29 ENCOUNTER — Telehealth: Payer: Self-pay | Admitting: Family

## 2020-11-29 NOTE — Telephone Encounter (Signed)
Pt returned Terry [name] call/ she called her old employer to check on her insurance status/ Pt stated she doesn't have insurance and not sure why she is listed as having [employer]/ she doesn't have income for an insurance company to deduct or charge her for/ pt asked to keep her Monday appt/ please advise

## 2020-11-29 NOTE — Telephone Encounter (Signed)
I return Pt call, I explain to her according to the system her insurance still active, she need to contact the insurance to see what date her insurance will be or has been terminate and we need a letter from them [employer]) inform her that the termination date of the insurance and then schedule a financial appt

## 2020-11-29 NOTE — Telephone Encounter (Signed)
I call Pt to inform that the Appt will be cancel since she had insurance

## 2020-12-05 ENCOUNTER — Ambulatory Visit: Payer: Managed Care, Other (non HMO)

## 2021-01-11 ENCOUNTER — Other Ambulatory Visit: Payer: Self-pay

## 2021-01-11 ENCOUNTER — Ambulatory Visit: Payer: Self-pay | Attending: Family

## 2021-02-04 ENCOUNTER — Other Ambulatory Visit: Payer: Self-pay | Admitting: Family

## 2021-02-04 DIAGNOSIS — I10 Essential (primary) hypertension: Secondary | ICD-10-CM

## 2021-02-04 NOTE — Progress Notes (Signed)
Patient ID: Terry [name], female    DOB: [birth year]  MRN: [id number]  CC: Hypertension and Diabetes Follow-Up  Subjective: Terry [name] is a 65 y.o. female who presents for hypertension and diabetes follow-up. Her concerns today include:   1. HYPERTENSION FOLLOW-UP: 11/07/2020: - Continue Amlodipine and Lisinopril-Hydrochlorothiazide as prescribed.   02/06/2021:  Currently taking: see medication list Med Adherence: '[x]'  Yes    '[]'  No Medication side effects: '[]'  Yes    '[x]'  No Adherence with salt restriction (low-salt diet): '[x]'  Yes    '[]'  No Exercise: Yes '[x]'  No '[]'  Home Monitoring?: '[x]'  Yes    '[]'  No Monitoring Frequency: '[]'  Yes    '[x]'  No Home BP results range: '[]'  Yes    '[x]'  No Smoking '[]'  Yes '[x]'  No SOB? '[]'  Yes    '[x]'  No Chest Pain?: '[]'  Yes    '[x]'  No Leg swelling?: '[]'  Yes    '[x]'  No Headaches?: '[]'  Yes    '[x]'  No Dizziness? '[x]'  Yes    '[]'  No  2. DIABETES TYPE 2 FOLLOW-UP: 11/07/2020: - Hemoglobin A1c at goal today at 6.0%, goal < 7%.  - Continue Metformin as prescribed.   02/06/2021: Dizziness and lightheadedness  Increased cravings  Last A1C: 6.1% on 02/06/2021  Med Adherence:  '[x]'  Yes    '[]'  No Medication side effects:  '[]'  Yes    '[x]'  No Home Monitoring?  '[x]'  Yes    '[]'  No Home glucose results range: 90's before meals  Diet Adherence: '[x]'  Yes    '[]'  No Exercise: '[x]'  Yes    '[]'  No Hypoglycemic episodes?: '[]'  Yes    '[x]'  No Numbness of the feet? '[]'  Yes    '[x]'  No Retinopathy hx? '[]'  Yes    '[x]'  No  Patient Active Problem List   Diagnosis Date Noted  . Abnormal mammogram 10/31/2020  . Allergic rhinitis 10/31/2020  . Cervical disc disease 10/31/2020  . Colon cancer screening 10/31/2020  . Gastroesophageal reflux disease 10/31/2020  . Hypercalcemia 10/31/2020  . Hypertension 10/31/2020  . Insomnia 10/31/2020  . Irritable bowel syndrome 10/31/2020  . Lactose intolerance 10/31/2020  . Generalized osteoarthritis of hand 10/31/2020  . Long term (current) use of anticoagulants  10/31/2020  . Osteoarthritis 10/31/2020  . Migraine 10/31/2020  . Personal history of colonic polyps 10/31/2020  . Type 2 diabetes mellitus with other specified complication ([place]) 67/61/9509  . Bunion 10/31/2020  . Prediabetes 10/31/2020  . Menopausal flushing 10/31/2020  . Onychomycosis 07/25/2020  . Bilateral bunions 07/25/2020  . Estrogen deficiency      Current Outpatient Medications on File Prior to Visit  Medication Sig Dispense Refill  . amitriptyline (ELAVIL) 10 MG tablet Take 10 mg by mouth at bedtime.    . Blood Glucose Monitoring Suppl (ONETOUCH VERIO REFLECT) w/Device KIT USE AS DIRECTED 1 kit 0  . Blood Glucose Monitoring Suppl (TRUE METRIX METER) w/Device KIT Use as directed 1 kit 0  . ciclopirox (PENLAC) 8 % solution APPLY TOPICALLY AT BEDTIME OVER NAIL AND SURROUNDING SKIN. APPLY DAILY OVER PREVIOUS COAT. AFTER SEVEN (7) DAYS, MAY REMOVE WITH ALCOHOL AND CONTINUE CYCLE. 6.6 mL 0  . diclofenac (VOLTAREN) 75 MG EC tablet Take 75 mg by mouth 2 (two) times daily as needed.    Marland Kitchen glucose blood test strip USE AS INSTRUCTED 100 strip 12  . Lancets (ONETOUCH DELICA PLUS [id number]) MISC USE AS DIRECTED 100 each 4  . TRUEplus Lancets 28G MISC Use as directed 100 each 4   No current  facility-administered medications on file prior to visit.    Allergies  Allergen Reactions  . Codeine Nausea And Vomiting    Social History   Socioeconomic History  . Marital status: Single    Spouse name: Not on file  . Number of children: 2  . Years of education: 12th  . Highest education level: Not on file  Occupational History    Employer: [employer]  Tobacco Use  . Smoking status: Never Smoker  . Smokeless tobacco: Never Used  Vaping Use  . Vaping Use: Never used  Substance and Sexual Activity  . Alcohol use: Yes    Comment: socially  . Drug use: No  . Sexual activity: Not on file  Other Topics Concern  . Not on file  Social History Narrative   Pt lives at home  with family.   Caffeine Use: 1-2 cups occasionally   Social Determinants of Health   Financial Resource Strain: Not on file  Food Insecurity: Not on file  Transportation Needs: Not on file  Physical Activity: Not on file  Stress: Not on file  Social Connections: Not on file  Intimate Partner Violence: Not on file    Family History  Problem Relation Age of Onset  . Dementia Mother   . Bladder Cancer Father   . Breast cancer Sister     Past Surgical History:  Procedure Laterality Date  . PARTIAL HYSTERECTOMY      ROS: Review of Systems Negative except as stated above  PHYSICAL EXAM: BP 125/86 (BP Location: Left Arm, Patient Position: Sitting)   Pulse 79   Ht 5' 5.98" (1.676 m)   Wt 168 lb 12.8 oz (76.6 kg)   SpO2 98%   BMI 27.26 kg/m   Wt Readings from Last 3 Encounters:  02/06/21 168 lb 12.8 oz (76.6 kg)  11/07/20 169 lb (76.7 kg)  01/11/15 175 lb (79.4 kg)    Physical Exam HENT:     Head: Normocephalic and atraumatic.  Eyes:     Extraocular Movements: Extraocular movements intact.     Conjunctiva/sclera: Conjunctivae normal.     Pupils: Pupils are equal, round, and reactive to light.  Cardiovascular:     Rate and Rhythm: Normal rate and regular rhythm.     Pulses: Normal pulses.     Heart sounds: Normal heart sounds.  Pulmonary:     Effort: Pulmonary effort is normal.     Breath sounds: Normal breath sounds.  Musculoskeletal:     Cervical back: Normal range of motion and neck supple.  Neurological:     General: No focal deficit present.     Mental Status: She is alert and oriented to person, place, and time.  Psychiatric:        Mood and Affect: Mood normal.        Behavior: Behavior normal.    ASSESSMENT AND PLAN: 1. Essential hypertension: - Blood pressure at goal during today's visit. Level of blood pressure control unknown as not consisently monitoring blood pressure at home.  - Continue Amlodipine and Lisinopril-Hydrochlorothiazide as  prescribed. - Counseled on blood pressure goal of less than 130/80, low-sodium, DASH diet, medication compliance, 150 minutes of moderate intensity exercise per week as tolerated. Discussed medication compliance, adverse effects. - BMP to evaluate kidney function and electrolyte balance. - Follow-up with primary provider in 3 months or sooner if needed.  - Basic Metabolic Panel - lisinopril-hydrochlorothiazide (ZESTORETIC) 20-12.5 MG tablet; TAKE 1 TABLET BY MOUTH DAILY.  Dispense: 90 tablet;  Refill: 0 - amLODipine (NORVASC) 5 MG tablet; TAKE 1 TABLET (5 MG TOTAL) BY MOUTH DAILY.  Dispense: 90 tablet; Refill: 0  2. Type 2 diabetes mellitus without complication, without long-term current use of insulin ([place]): - Hemoglobin A1c at goal today at 6.1%, goal < 7%. This is similar to previous hemoglobin A1c of 6.0% on 11/07/2020. Next hemoglobin due July 2022.  - Continue Metformin as prescribed.  - Discussed the importance of healthy eating habits, low-carbohydrate diet, low-sugar diet, regular aerobic exercise (at least 150 minutes a week as tolerated) and medication compliance to achieve or maintain control of diabetes. - BMP to evaluate kidney function and electrolyte balance. - Follow-up with primary provider in 3 months or sooner if needed.  - POCT glycosylated hemoglobin (Hb J5U) - Basic Metabolic Panel - metFORMIN (GLUCOPHAGE) 500 MG tablet; TAKE 1 TABLET (500 MG TOTAL) BY MOUTH 2 (TWO) TIMES DAILY WITH A MEAL.  Dispense: 180 tablet; Refill: 0   Patient was given the opportunity to ask questions.  Patient verbalized understanding of the plan and was able to repeat key elements of the plan. Patient was given clear instructions to go to Emergency Department or return to medical center if symptoms don't improve, worsen, or new problems develop.The patient verbalized understanding.   Orders Placed This Encounter  Procedures  . Basic Metabolic Panel  . POCT glycosylated hemoglobin (Hb A1C)      Requested Prescriptions   Signed Prescriptions Disp Refills  . lisinopril-hydrochlorothiazide (ZESTORETIC) 20-12.5 MG tablet 90 tablet 0    Sig: TAKE 1 TABLET BY MOUTH DAILY.  Marland Kitchen amLODipine (NORVASC) 5 MG tablet 90 tablet 0    Sig: TAKE 1 TABLET (5 MG TOTAL) BY MOUTH DAILY.  . metFORMIN (GLUCOPHAGE) 500 MG tablet 180 tablet 0    Sig: TAKE 1 TABLET (500 MG TOTAL) BY MOUTH 2 (TWO) TIMES DAILY WITH A MEAL.    Return in about 3 months (around 05/08/2021) for Follow-Up hypertension and diabetes .  Camillia Herter, NP

## 2021-02-06 ENCOUNTER — Ambulatory Visit: Payer: Managed Care, Other (non HMO) | Admitting: Family

## 2021-02-06 ENCOUNTER — Ambulatory Visit (INDEPENDENT_AMBULATORY_CARE_PROVIDER_SITE_OTHER): Payer: Self-pay | Admitting: Family

## 2021-02-06 ENCOUNTER — Encounter: Payer: Self-pay | Admitting: Family

## 2021-02-06 ENCOUNTER — Other Ambulatory Visit: Payer: Self-pay

## 2021-02-06 VITALS — BP 125/86 | HR 79 | Ht 65.98 in | Wt 168.8 lb

## 2021-02-06 DIAGNOSIS — E119 Type 2 diabetes mellitus without complications: Secondary | ICD-10-CM

## 2021-02-06 DIAGNOSIS — I10 Essential (primary) hypertension: Secondary | ICD-10-CM

## 2021-02-06 LAB — POCT GLYCOSYLATED HEMOGLOBIN (HGB A1C): Hemoglobin A1C: 6.1 % — AB (ref 4.0–5.6)

## 2021-02-06 MED ORDER — AMLODIPINE BESYLATE 5 MG PO TABS: ORAL_TABLET | Freq: Every day | ORAL | 0 refills | Status: DC

## 2021-02-06 MED ORDER — LISINOPRIL-HYDROCHLOROTHIAZIDE 20-12.5 MG PO TABS: 1.0000 | ORAL_TABLET | Freq: Every day | ORAL | 0 refills | Status: DC

## 2021-02-06 MED ORDER — METFORMIN HCL 500 MG PO TABS
ORAL_TABLET | Freq: Two times a day (BID) | ORAL | 0 refills | Status: DC
  Filled 2021-04-26 – 2021-05-05 (×2): qty 60, 30d supply, fill #0

## 2021-02-06 NOTE — Patient Instructions (Addendum)
Continue Amlodipine and Lisinopril-Hydrochlorothiazide for high blood pressure. Follow-up in 3 months or sooner if needed.   Continue Metformin for diabetes. Follow-up in 3 months or sooner if needed.   Labs today.  Hypertension, Adult Hypertension is another name for high blood pressure. High blood pressure forces your heart to work harder to pump blood. This can cause problems over time. There are two numbers in a blood pressure reading. There is a top number (systolic) over a bottom number (diastolic). It is best to have a blood pressure that is below 120/80. Healthy choices can help lower your blood pressure, or you may need medicine to help lower it. What are the causes? The cause of this condition is not known. Some conditions may be related to high blood pressure. What increases the risk?  Smoking.  Having type 2 diabetes mellitus, high cholesterol, or both.  Not getting enough exercise or physical activity.  Being overweight.  Having too much fat, sugar, calories, or salt (sodium) in your diet.  Drinking too much alcohol.  Having long-term (chronic) kidney disease.  Having a family history of high blood pressure.  Age. Risk increases with age.  Race. You may be at higher risk if you are African American.  Gender. Men are at higher risk than women before age 49. After age 21, women are at higher risk than men.  Having obstructive sleep apnea.  Stress. What are the signs or symptoms?  High blood pressure may not cause symptoms. Very high blood pressure (hypertensive crisis) may cause: ? Headache. ? Feelings of worry or nervousness (anxiety). ? Shortness of breath. ? Nosebleed. ? A feeling of being sick to your stomach (nausea). ? Throwing up (vomiting). ? Changes in how you see. ? Very bad chest pain. ? Seizures. How is this treated?  This condition is treated by making healthy lifestyle changes, such as: ? Eating healthy foods. ? Exercising  more. ? Drinking less alcohol.  Your health care provider may prescribe medicine if lifestyle changes are not enough to get your blood pressure under control, and if: ? Your top number is above 130. ? Your bottom number is above 80.  Your personal target blood pressure may vary. Follow these instructions at home: Eating and drinking  If told, follow the DASH eating plan. To follow this plan: ? Fill one half of your plate at each meal with fruits and vegetables. ? Fill one fourth of your plate at each meal with whole grains. Whole grains include whole-wheat pasta, brown rice, and whole-grain bread. ? Eat or drink low-fat dairy products, such as skim milk or low-fat yogurt. ? Fill one fourth of your plate at each meal with low-fat (lean) proteins. Low-fat proteins include fish, chicken without skin, eggs, beans, and tofu. ? Avoid fatty meat, cured and processed meat, or chicken with skin. ? Avoid pre-made or processed food.  Eat less than 1,500 mg of salt each day.  Do not drink alcohol if: ? Your doctor tells you not to drink. ? You are pregnant, may be pregnant, or are planning to become pregnant.  If you drink alcohol: ? Limit how much you use to:  0-1 drink a day for women.  0-2 drinks a day for men. ? Be aware of how much alcohol is in your drink. In the [place] one drink equals one 12 oz bottle of beer (355 mL), one 5 oz glass of wine (148 mL), or one 1 oz glass of hard liquor (44 mL).  Lifestyle  Work with your doctor to stay at a healthy weight or to lose weight. Ask your doctor what the best weight is for you.  Get at least 30 minutes of exercise most days of the week. This may include walking, swimming, or biking.  Get at least 30 minutes of exercise that strengthens your muscles (resistance exercise) at least 3 days a week. This may include lifting weights or doing Pilates.  Do not use any products that contain nicotine or tobacco, such as cigarettes, e-cigarettes,  and chewing tobacco. If you need help quitting, ask your doctor.  Check your blood pressure at home as told by your doctor.  Keep all follow-up visits as told by your doctor. This is important.   Medicines  Take over-the-counter and prescription medicines only as told by your doctor. Follow directions carefully.  Do not skip doses of blood pressure medicine. The medicine does not work as well if you skip doses. Skipping doses also puts you at risk for problems.  Ask your doctor about side effects or reactions to medicines that you should watch for. Contact a doctor if you:  Think you are having a reaction to the medicine you are taking.  Have headaches that keep coming back (recurring).  Feel dizzy.  Have swelling in your ankles.  Have trouble with your vision. Get help right away if you:  Get a very bad headache.  Start to feel mixed up (confused).  Feel weak or numb.  Feel faint.  Have very bad pain in your: ? Chest. ? Belly (abdomen).  Throw up more than once.  Have trouble breathing. Summary  Hypertension is another name for high blood pressure.  High blood pressure forces your heart to work harder to pump blood.  For most people, a normal blood pressure is less than 120/80.  Making healthy choices can help lower blood pressure. If your blood pressure does not get lower with healthy choices, you may need to take medicine. This information is not intended to replace advice given to you by your health care provider. Make sure you discuss any questions you have with your health care provider. Document Revised: 06/11/2018 Document Reviewed: 06/11/2018 Elsevier Patient Education  2021 [employer].

## 2021-02-06 NOTE — Progress Notes (Signed)
Follow up.

## 2021-02-07 LAB — BASIC METABOLIC PANEL
BUN/Creatinine Ratio: 18 (ref 12–28)
BUN: 12 mg/dL (ref 8–27)
CO2: 23 mmol/L (ref 20–29)
Calcium: 10 mg/dL (ref 8.7–10.3)
Chloride: 104 mmol/L (ref 96–106)
Creatinine, Ser: 0.65 mg/dL (ref 0.57–1.00)
Glucose: 86 mg/dL (ref 65–99)
Potassium: 4.5 mmol/L (ref 3.5–5.2)
Sodium: 145 mmol/L — ABNORMAL HIGH (ref 134–144)
eGFR: 99 mL/min/{1.73_m2} (ref >59)

## 2021-02-07 NOTE — Progress Notes (Signed)
Kidney function normal.   Diabetes discussed in office.

## 2021-04-26 ENCOUNTER — Other Ambulatory Visit: Payer: Self-pay | Admitting: Family

## 2021-04-26 ENCOUNTER — Other Ambulatory Visit: Payer: Self-pay

## 2021-04-26 DIAGNOSIS — I10 Essential (primary) hypertension: Secondary | ICD-10-CM

## 2021-04-26 MED ORDER — LISINOPRIL-HYDROCHLOROTHIAZIDE 20-12.5 MG PO TABS
1.0000 | ORAL_TABLET | Freq: Every day | ORAL | 0 refills | Status: DC
  Filled 2021-04-26 – 2021-05-05 (×2): qty 30, 30d supply, fill #0

## 2021-04-26 NOTE — Telephone Encounter (Signed)
Please fill if appropriate at this time.

## 2021-04-26 NOTE — Telephone Encounter (Signed)
Per patient request Lisinopril-Hydrochlorothiazide (Zestoretic) refilled for 30 day courtesy supply. Please schedule appointment for additional refills.

## 2021-04-27 ENCOUNTER — Other Ambulatory Visit: Payer: Self-pay

## 2021-05-02 ENCOUNTER — Other Ambulatory Visit: Payer: Self-pay

## 2021-05-03 ENCOUNTER — Other Ambulatory Visit: Payer: Self-pay

## 2021-05-04 ENCOUNTER — Other Ambulatory Visit: Payer: Self-pay

## 2021-05-05 ENCOUNTER — Other Ambulatory Visit: Payer: Self-pay

## 2021-05-06 NOTE — Progress Notes (Signed)
Patient ID: Terry [name], female    DOB: 12/30/1955  MRN: [id number]  CC: Hypertension and Diabetes Follow-Up   Subjective: Terry [name] is a 65 y.o. female who presents for hypertension and diabetes follow-up.   Her concerns today include:   HYPERTENSION FOLLOW-UP: 02/06/2021: - Continue Amlodipine and Lisinopril-Hydrochlorothiazide as prescribed.  05/08/2021: Doing well on current regimen. No side effects. No issues/concerns. Denies chest pain and shortness of breath.    2. DIABETES TYPE 2 FOLLOW-UP: 02/06/2021: - Hemoglobin A1c at goal today at 6.1%, goal < 7%. This is similar to previous hemoglobin A1c of 6.0% on 11/07/2020. Next hemoglobin due July 2022. - Continue Metformin as prescribed.  05/08/2021: Doing well on current regimen. No side effects. No issues/concerns.   3. CHRONIC SHOULDER PAIN: Bilateral shoulder pain. Had injections in shoulders in the past and did not help. Using Diclofenac gel for relief.   4. BLOATING WITH NAUSEA: Intermittent bloating and nausea, not sure of exact causes. Notices minor fluctuations in weight depending on bloating. Requesting refills on Promethazine suppositories which helps.    Patient Active Problem List   Diagnosis Date Noted   Abnormal mammogram 10/31/2020   Allergic rhinitis 10/31/2020   Cervical disc disease 10/31/2020   Colon cancer screening 10/31/2020   Gastroesophageal reflux disease 10/31/2020   Hypercalcemia 10/31/2020   Hypertension 10/31/2020   Insomnia 10/31/2020   Irritable bowel syndrome 10/31/2020   Lactose intolerance 10/31/2020   Generalized osteoarthritis of hand 10/31/2020   Long term (current) use of anticoagulants 10/31/2020   Osteoarthritis 10/31/2020   Migraine 10/31/2020   Personal history of colonic polyps 10/31/2020   Type 2 diabetes mellitus with other specified complication ([place]) 45/36/4680   Bunion 10/31/2020   Prediabetes 10/31/2020   Menopausal flushing 10/31/2020   Onychomycosis  07/25/2020   Bilateral bunions 07/25/2020   Estrogen deficiency      Current Outpatient Medications on File Prior to Visit  Medication Sig Dispense Refill   amitriptyline (ELAVIL) 10 MG tablet Take 10 mg by mouth at bedtime.     Blood Glucose Monitoring Suppl (ONETOUCH VERIO REFLECT) w/Device KIT USE AS DIRECTED 1 kit 0   Blood Glucose Monitoring Suppl (TRUE METRIX METER) w/Device KIT Use as directed 1 kit 0   ciclopirox (PENLAC) 8 % solution APPLY TOPICALLY AT BEDTIME OVER NAIL AND SURROUNDING SKIN. APPLY DAILY OVER PREVIOUS COAT. AFTER SEVEN (7) DAYS, MAY REMOVE WITH ALCOHOL AND CONTINUE CYCLE. 6.6 mL 0   diclofenac (VOLTAREN) 75 MG EC tablet Take 75 mg by mouth 2 (two) times daily as needed.     glucose blood test strip USE AS INSTRUCTED 100 strip 12   Lancets (ONETOUCH DELICA PLUS [id number]) MISC USE AS DIRECTED 100 each 4   TRUEplus Lancets 28G MISC Use as directed 100 each 4   No current facility-administered medications on file prior to visit.    Allergies  Allergen Reactions   Codeine Nausea And Vomiting    Social History   Socioeconomic History   Marital status: Single    Spouse name: Not on file   Number of children: 2   Years of education: 12th   Highest education level: Not on file  Occupational History    Employer: [employer]  Tobacco Use   Smoking status: Never   Smokeless tobacco: Never  Vaping Use   Vaping Use: Never used  Substance and Sexual Activity   Alcohol use: Yes    Comment: socially   Drug use: No  Sexual activity: Not on file  Other Topics Concern   Not on file  Social History Narrative   Pt lives at home with family.   Caffeine Use: 1-2 cups occasionally   Social Determinants of Health   Financial Resource Strain: Not on file  Food Insecurity: Not on file  Transportation Needs: Not on file  Physical Activity: Not on file  Stress: Not on file  Social Connections: Not on file  Intimate Partner Violence: Not on file     Family History  Problem Relation Age of Onset   Dementia Mother    Bladder Cancer Father    Breast cancer Sister     Past Surgical History:  Procedure Laterality Date   PARTIAL HYSTERECTOMY      ROS: Review of Systems Negative except as stated above  PHYSICAL EXAM: BP 124/81 (BP Location: Left Arm, Patient Position: Sitting, Cuff Size: Normal)   Pulse 88   Temp 97.9 F (36.6 C)   Resp 16   Ht 5' 5.98" (1.676 m)   Wt 169 lb 3.2 oz (76.7 kg)   SpO2 97%   BMI 27.32 kg/m   Physical Exam HENT:     Head: Normocephalic and atraumatic.  Eyes:     Extraocular Movements: Extraocular movements intact.     Conjunctiva/sclera: Conjunctivae normal.     Pupils: Pupils are equal, round, and reactive to light.  Cardiovascular:     Rate and Rhythm: Normal rate and regular rhythm.     Pulses: Normal pulses.     Heart sounds: Normal heart sounds.  Pulmonary:     Effort: Pulmonary effort is normal.     Breath sounds: Normal breath sounds.  Musculoskeletal:     Cervical back: Normal range of motion and neck supple.  Neurological:     General: No focal deficit present.     Mental Status: She is alert and oriented to person, place, and time.  Psychiatric:        Mood and Affect: Mood normal.        Behavior: Behavior normal.   ASSESSMENT AND PLAN: 1. Essential hypertension: - Continue Amlodipine and Lisinopril-Hydrochlorothiazide as prescribed.  - Counseled on blood pressure goal of less than 130/80, low-sodium, DASH diet, medication compliance, 150 minutes of moderate intensity exercise per week as tolerated. Discussed medication compliance, adverse effects. - Follow-up with primary provider in 3 months or sooner if needed.  - amLODipine (NORVASC) 5 MG tablet; Take 1 tablet (5 mg total) by mouth daily.  Dispense: 90 tablet; Refill: 0 - lisinopril-hydrochlorothiazide (ZESTORETIC) 20-12.5 MG tablet; Take 1 tablet by mouth daily.  Dispense: 90 tablet; Refill: 0  2. Type 2  diabetes mellitus without complication, without long-term current use of insulin ([facility]): - Hemoglobin A1c today at goal at 6.1%, goal < 7%. This is the same as previous hemoglobin A1c 6.1% on 02/06/2021. Next hemoglobin A1c due October 2022.  - Continue Metformin as prescribed.  - To achieve an A1C goal of less than or equal to 7.0 percent, a fasting blood sugar of 80 to 130 mg/dL and a postprandial glucose (90 to 120 minutes after a meal) less than 180 mg/dL. In the event of sugars less than 60 mg/dl or greater than 400 mg/dl please notify the clinic ASAP. It is recommended that you undergo annual eye exams and annual foot exams. - Follow-up with primary provider in 3 months or sooner if needed.  - POCT glycosylated hemoglobin (Hb A1C) - metFORMIN (GLUCOPHAGE) 500 MG tablet; Take   1 tablet (500 mg total) by mouth 2 (two) times daily with a meal.  Dispense: 180 tablet; Refill: 0  3. Chronic pain of both shoulders: - Continue Diclofenac gel as prescribed. Patient reports she does not need refills currently.   4. Bloating: 5. Nausea and vomiting, intractability of vomiting not specified, unspecified vomiting type: - Intermittent bloating and nausea.  - Continue Promethazine suppository as prescribed.  - Follow-up with primary provider as scheduled.  - promethazine (PHENERGAN) 12.5 MG suppository; Place 1 suppository (12.5 mg total) rectally every 6 (six) hours as needed for nausea or vomiting.  Dispense: 12 each; Refill: 0    Patient was given the opportunity to ask questions.  Patient verbalized understanding of the plan and was able to repeat key elements of the plan. Patient was given clear instructions to go to Emergency Department or return to medical center if symptoms don't improve, worsen, or new problems develop.The patient verbalized understanding.   Orders Placed This Encounter  Procedures   POCT glycosylated hemoglobin (Hb A1C)    Requested Prescriptions   Signed Prescriptions  Disp Refills   amLODipine (NORVASC) 5 MG tablet 90 tablet 0    Sig: Take 1 tablet (5 mg total) by mouth daily.   lisinopril-hydrochlorothiazide (ZESTORETIC) 20-12.5 MG tablet 90 tablet 0    Sig: Take 1 tablet by mouth daily.   metFORMIN (GLUCOPHAGE) 500 MG tablet 180 tablet 0    Sig: Take 1 tablet (500 mg total) by mouth 2 (two) times daily with a meal.   promethazine (PHENERGAN) 12.5 MG suppository 12 each 0    Sig: Place 1 suppository (12.5 mg total) rectally every 6 (six) hours as needed for nausea or vomiting.    Return in about 3 months (around 08/08/2021) for Follow-Up hypertension and diabetes .  Camillia Herter, NP

## 2021-05-08 ENCOUNTER — Other Ambulatory Visit: Payer: Self-pay

## 2021-05-08 ENCOUNTER — Ambulatory Visit (INDEPENDENT_AMBULATORY_CARE_PROVIDER_SITE_OTHER): Payer: Self-pay | Admitting: Family

## 2021-05-08 VITALS — BP 124/81 | HR 88 | Temp 97.9°F | Resp 16 | Ht 65.98 in | Wt 169.2 lb

## 2021-05-08 DIAGNOSIS — M25512 Pain in left shoulder: Secondary | ICD-10-CM

## 2021-05-08 DIAGNOSIS — R140 Abdominal distension (gaseous): Secondary | ICD-10-CM

## 2021-05-08 DIAGNOSIS — E119 Type 2 diabetes mellitus without complications: Secondary | ICD-10-CM

## 2021-05-08 DIAGNOSIS — M25511 Pain in right shoulder: Secondary | ICD-10-CM

## 2021-05-08 DIAGNOSIS — G8929 Other chronic pain: Secondary | ICD-10-CM

## 2021-05-08 DIAGNOSIS — R112 Nausea with vomiting, unspecified: Secondary | ICD-10-CM

## 2021-05-08 DIAGNOSIS — I10 Essential (primary) hypertension: Secondary | ICD-10-CM

## 2021-05-08 LAB — POCT GLYCOSYLATED HEMOGLOBIN (HGB A1C): Hemoglobin A1C: 6.1 % — AB (ref 4.0–5.6)

## 2021-05-08 MED ORDER — LISINOPRIL-HYDROCHLOROTHIAZIDE 20-12.5 MG PO TABS
1.0000 | ORAL_TABLET | Freq: Every day | ORAL | 0 refills | Status: DC
  Filled 2021-05-08 (×2): qty 90, 90d supply, fill #0

## 2021-05-08 MED ORDER — PROMETHAZINE HCL 12.5 MG RE SUPP
12.5000 mg | Freq: Four times a day (QID) | RECTAL | 0 refills | Status: AC | PRN
  Filled 2021-05-08: qty 12, 3d supply, fill #0

## 2021-05-08 MED ORDER — LISINOPRIL-HYDROCHLOROTHIAZIDE 20-12.5 MG PO TABS: 1.0000 | ORAL_TABLET | Freq: Every day | ORAL | 0 refills | Status: DC

## 2021-05-08 MED ORDER — METFORMIN HCL 500 MG PO TABS: 500.0000 mg | ORAL_TABLET | Freq: Two times a day (BID) | ORAL | 0 refills | Status: DC

## 2021-05-08 MED ORDER — AMLODIPINE BESYLATE 5 MG PO TABS: 5.0000 mg | ORAL_TABLET | Freq: Every day | ORAL | 0 refills | Status: DC

## 2021-05-08 MED ORDER — AMLODIPINE BESYLATE 5 MG PO TABS
5.0000 mg | ORAL_TABLET | Freq: Every day | ORAL | 0 refills | Status: DC
  Filled 2021-05-08: qty 30, 30d supply, fill #0

## 2021-05-08 MED ORDER — METFORMIN HCL 500 MG PO TABS
500.0000 mg | ORAL_TABLET | Freq: Two times a day (BID) | ORAL | 0 refills | Status: DC
  Filled 2021-05-08 (×2): qty 180, 90d supply, fill #0

## 2021-05-08 NOTE — Progress Notes (Signed)
Pt presents for diabetes and hypertension f/u. Request refills on Amlodipine 5 mg and promethazine suppository. Pt states that she is still experiencing bloating

## 2021-05-09 ENCOUNTER — Other Ambulatory Visit: Payer: Self-pay

## 2021-07-31 ENCOUNTER — Encounter: Payer: Self-pay | Admitting: Emergency Medicine

## 2021-07-31 ENCOUNTER — Ambulatory Visit
Admission: EM | Admit: 2021-07-31 | Discharge: 2021-07-31 | Disposition: A | Discharge status: Home / Self Care | Payer: Self-pay | Attending: Internal Medicine | Admitting: Internal Medicine

## 2021-07-31 ENCOUNTER — Ambulatory Visit (INDEPENDENT_AMBULATORY_CARE_PROVIDER_SITE_OTHER): Payer: Self-pay

## 2021-07-31 ENCOUNTER — Other Ambulatory Visit: Payer: Self-pay

## 2021-07-31 DIAGNOSIS — M25571 Pain in right ankle and joints of right foot: Secondary | ICD-10-CM

## 2021-07-31 DIAGNOSIS — M79671 Pain in right foot: Secondary | ICD-10-CM

## 2021-07-31 DIAGNOSIS — S93401A Sprain of unspecified ligament of right ankle, initial encounter: Secondary | ICD-10-CM

## 2021-07-31 DIAGNOSIS — W19XXXA Unspecified fall, initial encounter: Secondary | ICD-10-CM

## 2021-07-31 NOTE — ED Triage Notes (Signed)
Patient states that she missed a step and fell yesterday injuring her right foot/ankle.  The area is swollen and bruised, hard to put pressure on it.  Patient has taken Tylenol for the pain.

## 2021-07-31 NOTE — Discharge Instructions (Signed)
Your x-rays were negative for any fracture or dislocation.  An Ace wrap has been applied in urgent care to help with compression.  Please do not sleep in this.  Also use ice application and take ibuprofen as needed.  Follow-up with provided contact formation for orthopedist if pain persists over the next 1.5 to 2 weeks.

## 2021-07-31 NOTE — ED Provider Notes (Signed)
Terry [name]    CSN: [id number] Arrival date & time: 07/31/21  2725      History   Chief Complaint Chief Complaint  Patient presents with   Fall    HPI Terry [name] is a 65 y.o. female.   Patient presents for right ankle and foot pain that started yesterday after a fall that occurred.  Patient reports that she missed a step and her ankle twisted over.  Denies any head or losing consciousness during fall.  Currently having pain in the anterior portion of the right ankle as well as the dorsal surface of the foot.  Is able to bear weight but with pain.  Denies any numbness or tingling to extremity.  Denies any past injuries to the lower extremity.   Fall   Past Medical History:  Diagnosis Date   Estrogen deficiency    Hypertension    Migraine     Patient Active Problem List   Diagnosis Date Noted   Abnormal mammogram 10/31/2020   Allergic rhinitis 10/31/2020   Cervical disc disease 10/31/2020   Colon cancer screening 10/31/2020   Gastroesophageal reflux disease 10/31/2020   Hypercalcemia 10/31/2020   Hypertension 10/31/2020   Insomnia 10/31/2020   Irritable bowel syndrome 10/31/2020   Lactose intolerance 10/31/2020   Generalized osteoarthritis of hand 10/31/2020   Long term (current) use of anticoagulants 10/31/2020   Osteoarthritis 10/31/2020   Migraine 10/31/2020   Personal history of colonic polyps 10/31/2020   Type 2 diabetes mellitus with other specified complication ([place]) 36/64/4034   Bunion 10/31/2020   Prediabetes 10/31/2020   Menopausal flushing 10/31/2020   Onychomycosis 07/25/2020   Bilateral bunions 07/25/2020   Estrogen deficiency     Past Surgical History:  Procedure Laterality Date   PARTIAL HYSTERECTOMY      OB History   No obstetric history on file.      Home Medications    Prior to Admission medications   Medication Sig Start Date End Date Taking? Authorizing Provider  amitriptyline (ELAVIL) 10 MG tablet Take 10  mg by mouth at bedtime. 07/04/20  Yes [provider]  amLODipine (NORVASC) 5 MG tablet Take 1 tablet (5 mg total) by mouth daily. 05/08/21 08/06/21 Yes Minette Brine, Amy J, NP  Blood Glucose Monitoring Suppl [facility] VERIO REFLECT) w/Device KIT USE AS DIRECTED 11/07/20 11/07/21 Yes Camillia Herter, NP  Blood Glucose Monitoring Suppl (TRUE METRIX METER) w/Device KIT Use as directed 11/07/20  Yes Minette Brine, Amy J, NP  ciclopirox (PENLAC) 8 % solution APPLY TOPICALLY AT BEDTIME OVER NAIL AND SURROUNDING SKIN. APPLY DAILY OVER PREVIOUS COAT. AFTER SEVEN (7) DAYS, MAY REMOVE WITH ALCOHOL AND CONTINUE CYCLE. 11/07/20 11/07/21 Yes Minette Brine, Amy J, NP  diclofenac (VOLTAREN) 75 MG EC tablet Take 75 mg by mouth 2 (two) times daily as needed. 07/12/20  Yes [provider]  glucose blood test strip USE AS INSTRUCTED 11/07/20 11/07/21 Yes Minette Brine, Amy J, NP  Lancets [facility] DELICA PLUS [id number]) MISC USE AS DIRECTED 11/07/20 11/07/21 Yes Minette Brine, Amy J, NP  lisinopril-hydrochlorothiazide (ZESTORETIC) 20-12.5 MG tablet Take 1 tablet by mouth daily. 05/08/21 08/07/21 Yes Minette Brine, Amy J, NP  metFORMIN (GLUCOPHAGE) 500 MG tablet Take 1 tablet (500 mg total) by mouth 2 (two) times daily with a meal. 05/08/21 08/07/21 Yes Minette Brine, Amy J, NP  promethazine (PHENERGAN) 12.5 MG suppository Place 1 suppository (12.5 mg total) rectally every 6 (six) hours as needed for nausea or vomiting. 05/08/21  Yes Camillia Herter, NP  TRUEplus Lancets  28G MISC Use as directed 11/07/20  Yes Camillia Herter, NP    Family History Family History  Problem Relation Age of Onset   Dementia Mother    Bladder Cancer Father    Breast cancer Sister     Social History Social History   Tobacco Use   Smoking status: Never   Smokeless tobacco: Never  Vaping Use   Vaping Use: Never used  Substance Use Topics   Alcohol use: Yes    Comment: socially   Drug use: No     Allergies   Codeine   Review of Systems Review of  Systems Per HPI  Physical Exam Triage Vital Signs ED Triage Vitals  Enc Vitals Group     BP 07/31/21 1152 (!) 146/65     Pulse Rate 07/31/21 1152 (!) 58     Resp 07/31/21 1152 18     Temp 07/31/21 1152 97.6 F (36.4 C)     Temp Source 07/31/21 1152 Oral     SpO2 07/31/21 1152 98 %     Weight 07/31/21 1154 170 lb (77.1 kg)     Height 07/31/21 1154 _0  (1.651 m)     Head Circumference --      Peak Flow --      Pain Score 07/31/21 1154 7     Pain Loc --      Pain Edu? --      Excl. in [place]? --    No data found.  Updated Vital Signs BP (!) 146/65 (BP Location: Left Arm)   Pulse (!) 58   Temp 97.6 F (36.4 C) (Oral)   Resp 18   Ht _1  (1.651 m)   Wt 170 lb (77.1 kg)   SpO2 98%   BMI 28.29 kg/m   Visual Acuity Right Eye Distance:   Left Eye Distance:   Bilateral Distance:    Right Eye Near:   Left Eye Near:    Bilateral Near:     Physical Exam Constitutional:      General: She is not in acute distress.    Appearance: Normal appearance. She is not toxic-appearing or diaphoretic.  HENT:     Head: Normocephalic and atraumatic.  Eyes:     Extraocular Movements: Extraocular movements intact.     Conjunctiva/sclera: Conjunctivae normal.  Pulmonary:     Effort: Pulmonary effort is normal.  Musculoskeletal:     Right ankle: Swelling present. No ecchymosis. Tenderness present. Decreased range of motion. Normal pulse.     Left ankle: Normal.     Right foot: Decreased range of motion. Normal capillary refill. Swelling, tenderness and bony tenderness present. No deformity or crepitus. Normal pulse.     Left foot: Normal.     Comments: Tenderness to palpation to anterior portion of right ankle that extends into the dorsal surface of right foot overlying the third through fourth metatarsals.  Neurovascular status intact.  Associated swelling noted to area of tenderness.  Neurological:     General: No focal deficit present.     Mental Status: She is alert and oriented to  person, place, and time. Mental status is at baseline.  Psychiatric:        Mood and Affect: Mood normal.        Behavior: Behavior normal.        Thought Content: Thought content normal.        Judgment: Judgment normal.     UC Treatments / Results  Labs (all labs  ordered are listed, but only abnormal results are displayed) Labs Reviewed - No data to display  EKG   Radiology DG Ankle Complete Right  Result Date: 07/31/2021 CLINICAL DATA:  Fall EXAM: RIGHT ANKLE - COMPLETE 3+ VIEW COMPARISON:  None. FINDINGS: There is no evidence of acute fracture or dislocation. There is anterior and lateral soft tissue swelling. There is an os peroneum. Small plantar calcaneal spur. IMPRESSION: Anterior and lateral soft tissue swelling. No acute osseous abnormality. Electronically Signed   By: Maurine Simmering M.D.   On: 07/31/2021 13:21   DG Foot Complete Right  Result Date: 07/31/2021 CLINICAL DATA:  Fall.  Right foot pain EXAM: RIGHT FOOT COMPLETE - 3+ VIEW COMPARISON:  None. FINDINGS: There is no evidence of fracture or dislocation. There is no evidence of arthropathy or other focal bone abnormality. Soft tissue swelling is noted in the region of the lateral hindfoot. IMPRESSION: No acute fracture or dislocation of the right foot. Soft tissue swelling in the region of the lateral hindfoot. Electronically Signed   By: Davina Poke D.O.   On: 07/31/2021 13:17    Procedures Procedures (including critical [name] time)  Medications Ordered in UC Medications - No data to display  Initial Impression / Assessment and Plan / UC Course  I have reviewed the triage vital signs and the nursing notes.  Pertinent labs & imaging results that were available during my [name] of the patient were reviewed by me and considered in my medical decision making (see chart for details).     Foot ankle x-ray were negative for any acute bony abnormalities.  Suspect ankle sprain.  Ace wrap applied in urgent [name].  RICE.   No red flags seen on exam and patient is neurovascularly intact.  Offered crutches but declined.  Patient to follow-up with provided contact information for orthopedist if pain persists over the next 1.5 to 2 weeks. Discussed strict return precautions. Patient verbalized understanding and is agreeable with plan.  Final Clinical Impressions(s) / UC Diagnoses   Final diagnoses:  Acute right ankle pain  Right foot pain  Sprain of right ankle, unspecified ligament, initial encounter     Discharge Instructions      Your x-rays were negative for any fracture or dislocation.  An Ace wrap has been applied in urgent [name] to help with compression.  Please do not sleep in this.  Also use ice application and take ibuprofen as needed.  Follow-up with provided contact formation for orthopedist if pain persists over the next 1.5 to 2 weeks.     ED Prescriptions   None    PDMP not reviewed this encounter.   Odis Luster, [place] 07/31/21 [phone]

## 2021-08-03 NOTE — Progress Notes (Signed)
Patient ID: [name], female    DOB: 1955-12-24  MRN: [id number]  CC: Hypertension Follow-Up  Subjective: Terry [name] is a 65 y.o. female who presents for hypertension follow-up.   Her concerns today include:   HYPERTENSION FOLLOW-UP: 05/08/2021: - Continue Amlodipine and Lisinopril-Hydrochlorothiazide as prescribed.   08/08/2021: Reports per Terry [name], Terry [name], she should keep today's appointment but to not get any blood work completed because her orange card is expired. Also, reports she recently found out her orange card was expired at a recent Urgent Care visit. Reports she is hesitant for today's visit because she does not want a bill. Patient prefers to reschedule once orange card has been renewed. Has appointment with Terry [name] on 08/11/2021.  2. DIABETES TYPE 2 FOLLOW-UP: 05/08/2021: - Hemoglobin A1c today at goal at 6.1%, goal < 7%. This is the same as previous hemoglobin A1c 6.1% on 02/06/2021. Next hemoglobin A1c due October 2022.  - Continue Metformin as prescribed.   08/08/2021: Reports per Terry [name], Terry [name], she should keep today's appointment but to not get any blood work completed because her orange card is expired. Also, reports she recently found out her orange card was expired at a recent Urgent Care visit. Reports she is hesitant for today's visit because she does not want a bill. Patient prefers to reschedule once orange card has been renewed. Has appointment with Terry [name] on 08/11/2021.   Patient Active Problem List   Diagnosis Date Noted   Abnormal mammogram 10/31/2020   Allergic rhinitis 10/31/2020   Cervical disc disease 10/31/2020   Colon cancer screening 10/31/2020   Gastroesophageal reflux disease 10/31/2020   Hypercalcemia 10/31/2020   Hypertension 10/31/2020   Insomnia 10/31/2020   Irritable bowel syndrome 10/31/2020   Lactose intolerance 10/31/2020   Generalized  osteoarthritis of hand 10/31/2020   Long term (current) use of anticoagulants 10/31/2020   Osteoarthritis 10/31/2020   Migraine 10/31/2020   Personal history of colonic polyps 10/31/2020   Type 2 diabetes mellitus with other specified complication ([place]) 07/37/1062   Bunion 10/31/2020   Prediabetes 10/31/2020   Menopausal flushing 10/31/2020   Onychomycosis 07/25/2020   Bilateral bunions 07/25/2020   Estrogen deficiency      Current Outpatient Medications on File Prior to Visit  Medication Sig Dispense Refill   amitriptyline (ELAVIL) 10 MG tablet Take 10 mg by mouth at bedtime.     Blood Glucose Monitoring Suppl (ONETOUCH VERIO REFLECT) w/Device KIT USE AS DIRECTED 1 kit 0   Blood Glucose Monitoring Suppl (TRUE METRIX METER) w/Device KIT Use as directed 1 kit 0   ciclopirox (PENLAC) 8 % solution APPLY TOPICALLY AT BEDTIME OVER NAIL AND SURROUNDING SKIN. APPLY DAILY OVER PREVIOUS COAT. AFTER SEVEN (7) DAYS, MAY REMOVE WITH ALCOHOL AND CONTINUE CYCLE. 6.6 mL 0   diclofenac (VOLTAREN) 75 MG EC tablet Take 75 mg by mouth 2 (two) times daily as needed.     glucose blood test strip USE AS INSTRUCTED 100 strip 12   Lancets (ONETOUCH DELICA PLUS [id number]) MISC USE AS DIRECTED 100 each 4   promethazine (PHENERGAN) 12.5 MG suppository Place 1 suppository (12.5 mg total) rectally every 6 (six) hours as needed for nausea or vomiting. 12 each 0   TRUEplus Lancets 28G MISC Use as directed 100 each 4   No current facility-administered medications on file prior to visit.    Allergies  Allergen Reactions   Codeine Nausea And Vomiting    Social History  Socioeconomic History   Marital status: Single    Spouse name: Not on file   Number of children: 2   Years of education: 12th   Highest education level: Not on file  Occupational History    Employer: [employer]  Tobacco Use   Smoking status: Never   Smokeless tobacco: Never  Vaping Use   Vaping Use: Never used  Substance  and Sexual Activity   Alcohol use: Yes    Comment: socially   Drug use: No   Sexual activity: Not on file  Other Topics Concern   Not on file  Social History Narrative   Pt lives at home with family.   Caffeine Use: 1-2 cups occasionally   Social Determinants of Health   Financial Resource Strain: Not on file  Food Insecurity: Not on file  Transportation Needs: Not on file  Physical Activity: Not on file  Stress: Not on file  Social Connections: Not on file  Intimate Partner Violence: Not on file    Family History  Problem Relation Age of Onset   Dementia Mother    Bladder Cancer Father    Breast cancer Sister     Past Surgical History:  Procedure Laterality Date   PARTIAL HYSTERECTOMY      ROS: Review of Systems Negative except as stated above  Physical Exam HENT:     Head: Normocephalic and atraumatic.  Eyes:     Extraocular Movements: Extraocular movements intact.     Conjunctiva/sclera: Conjunctivae normal.     Pupils: Pupils are equal, round, and reactive to light.  Cardiovascular:     Rate and Rhythm: Normal rate and regular rhythm.     Pulses: Normal pulses.     Heart sounds: Normal heart sounds.  Pulmonary:     Effort: Pulmonary effort is normal.     Breath sounds: Normal breath sounds.  Musculoskeletal:     Cervical back: Normal range of motion and neck supple.  Neurological:     General: No focal deficit present.     Mental Status: She is alert and oriented to person, place, and time.  Psychiatric:        Mood and Affect: Mood normal.        Behavior: Behavior normal.   ASSESSMENT AND PLAN: 1. Essential hypertension: - Continue Amlodipine and Lisinopril-Hydrochlorothiazide as prescribed. Will refill for 30 day courtesy refill. - Counseled on blood pressure goal of less than 130/80, low-sodium, DASH diet, medication compliance, 150 minutes of moderate intensity exercise per week as tolerated. Discussed medication compliance, adverse effects. -  Follow-up with primary provider in 4 weeks or sooner if needed pending approval of CAFA. Patient agreeable.  - amLODipine (NORVASC) 5 MG tablet; Take 1 tablet (5 mg total) by mouth daily.  Dispense: 30 tablet; Refill: 0 - lisinopril-hydrochlorothiazide (ZESTORETIC) 20-12.5 MG tablet; Take 1 tablet by mouth daily.  Dispense: 30 tablet; Refill: 0  2. Type 2 diabetes mellitus without complication, without long-term current use of insulin ([place]): - Continue Metformin as prescribed. Will refill for 30 day courtesy refill. - Discussed the importance of healthy eating habits, low-carbohydrate diet, low-sugar diet, regular aerobic exercise (at least 150 minutes a week as tolerated) and medication compliance to achieve or maintain control of diabetes. - Follow-up with primary provider in 4 weeks or sooner if needed pending approval of CAFA. Patient agreeable.  - metFORMIN (GLUCOPHAGE) 500 MG tablet; Take 1 tablet (500 mg total) by mouth 2 (two) times daily with a meal.  Dispense: 60 tablet; Refill: 0  3. Financial difficulties: - Keep appointment scheduled with Karen Kitchens, financial [name], on 08/11/2021.   Patient was given the opportunity to ask questions.  Patient verbalized understanding of the plan and was able to repeat key elements of the plan. Patient was given clear instructions to go to Emergency Department or return to medical center if symptoms don't improve, worsen, or new problems develop.The patient verbalized understanding.   Requested Prescriptions   Signed Prescriptions Disp Refills   amLODipine (NORVASC) 5 MG tablet 30 tablet 0    Sig: Take 1 tablet (5 mg total) by mouth daily.   lisinopril-hydrochlorothiazide (ZESTORETIC) 20-12.5 MG tablet 30 tablet 0    Sig: Take 1 tablet by mouth daily.   metFORMIN (GLUCOPHAGE) 500 MG tablet 60 tablet 0    Sig: Take 1 tablet (500 mg total) by mouth 2 (two) times daily with a meal.    Return in about 4 weeks (around 09/05/2021) for  Follow-Up or next available hypertension and diabetes .  Camillia Herter, NP

## 2021-08-08 ENCOUNTER — Other Ambulatory Visit: Payer: Self-pay

## 2021-08-08 ENCOUNTER — Ambulatory Visit (INDEPENDENT_AMBULATORY_CARE_PROVIDER_SITE_OTHER): Payer: Self-pay | Admitting: Family

## 2021-08-08 DIAGNOSIS — E119 Type 2 diabetes mellitus without complications: Secondary | ICD-10-CM

## 2021-08-08 DIAGNOSIS — I10 Essential (primary) hypertension: Secondary | ICD-10-CM

## 2021-08-08 DIAGNOSIS — Z599 Problem related to housing and economic circumstances, unspecified: Secondary | ICD-10-CM

## 2021-08-08 MED ORDER — METFORMIN HCL 500 MG PO TABS
500.0000 mg | ORAL_TABLET | Freq: Two times a day (BID) | ORAL | 0 refills | Status: AC
  Filled 2021-08-08 – 2021-09-12 (×2): qty 60, 30d supply, fill #0

## 2021-08-08 MED ORDER — AMLODIPINE BESYLATE 5 MG PO TABS
5.0000 mg | ORAL_TABLET | Freq: Every day | ORAL | 0 refills | Status: DC
Start: 2021-08-08 — End: 2021-11-25
  Filled 2021-08-08 – 2021-09-12 (×2): qty 30, 30d supply, fill #0

## 2021-08-08 MED ORDER — LISINOPRIL-HYDROCHLOROTHIAZIDE 20-12.5 MG PO TABS
1.0000 | ORAL_TABLET | Freq: Every day | ORAL | 0 refills | Status: DC
  Filled 2021-08-08 – 2021-09-12 (×2): qty 30, 30d supply, fill #0

## 2021-08-11 ENCOUNTER — Other Ambulatory Visit: Payer: Self-pay

## 2021-08-11 ENCOUNTER — Ambulatory Visit: Payer: Self-pay | Attending: Family

## 2021-08-15 ENCOUNTER — Other Ambulatory Visit: Payer: Self-pay

## 2021-08-30 ENCOUNTER — Telehealth: Payer: Self-pay | Admitting: Family

## 2021-08-30 NOTE — Telephone Encounter (Signed)
Pt was sent a letter from financial dept. Inform them, that the application they submitted was incomplete, since they were missing some documentation at the time of the appointment, Pt need to reschedule and resubmit all new papers and application for CAFA and [place], P.S. old documents has been sent back by mail to the Pt and Pt. need to make a new appt. 

## 2021-08-31 NOTE — Telephone Encounter (Signed)
Patient called in to inform Terry [name] that she does not want to be cancelled from the system say that she is still waiting for [employer] to send her a statement but that she have her July bank statement that she can bring in this morning. Please call patient for further information at Ph# [phone]

## 2021-08-31 NOTE — Telephone Encounter (Signed)
See encounter

## 2021-09-06 NOTE — Progress Notes (Deleted)
Patient ID: AVYA FLAVELL, female    DOB: 05-Oct-1956  MRN: [id number]  CC: Hypertension Follow-Up  Subjective: Terry [name] is a 65 y.o. female who presents for hypertension follow-up.   Her concerns today include:  HYPERTENSION FOLLOW-UP: 08/08/2021: - Continue Amlodipine and Lisinopril-Hydrochlorothiazide as prescribed. Will refill for 30 day courtesy refill.  09/12/2021:  2. DIABETES FOLLOW-UP: 08/08/2021: - Continue Metformin as prescribed. Will refill for 30 day courtesy refill.  09/12/2021:  Patient Active Problem List   Diagnosis Date Noted   Abnormal mammogram 10/31/2020   Allergic rhinitis 10/31/2020   Cervical disc disease 10/31/2020   Colon cancer screening 10/31/2020   Gastroesophageal reflux disease 10/31/2020   Hypercalcemia 10/31/2020   Hypertension 10/31/2020   Insomnia 10/31/2020   Irritable bowel syndrome 10/31/2020   Lactose intolerance 10/31/2020   Generalized osteoarthritis of hand 10/31/2020   Long term (current) use of anticoagulants 10/31/2020   Osteoarthritis 10/31/2020   Migraine 10/31/2020   Personal history of colonic polyps 10/31/2020   Type 2 diabetes mellitus with other specified complication ([place]) 70/10/7492   Bunion 10/31/2020   Prediabetes 10/31/2020   Menopausal flushing 10/31/2020   Onychomycosis 07/25/2020   Bilateral bunions 07/25/2020   Estrogen deficiency      Current Outpatient Medications on File Prior to Visit  Medication Sig Dispense Refill   amitriptyline (ELAVIL) 10 MG tablet Take 10 mg by mouth at bedtime.     amLODipine (NORVASC) 5 MG tablet Take 1 tablet (5 mg total) by mouth daily. 30 tablet 0   Blood Glucose Monitoring Suppl (ONETOUCH VERIO REFLECT) w/Device KIT USE AS DIRECTED 1 kit 0   Blood Glucose Monitoring Suppl (TRUE METRIX METER) w/Device KIT Use as directed 1 kit 0   ciclopirox (PENLAC) 8 % solution APPLY TOPICALLY AT BEDTIME OVER NAIL AND SURROUNDING SKIN. APPLY DAILY OVER PREVIOUS COAT. AFTER  SEVEN (7) DAYS, MAY REMOVE WITH ALCOHOL AND CONTINUE CYCLE. 6.6 mL 0   diclofenac (VOLTAREN) 75 MG EC tablet Take 75 mg by mouth 2 (two) times daily as needed.     glucose blood test strip USE AS INSTRUCTED 100 strip 12   Lancets (ONETOUCH DELICA PLUS [id number]) MISC USE AS DIRECTED 100 each 4   lisinopril-hydrochlorothiazide (ZESTORETIC) 20-12.5 MG tablet Take 1 tablet by mouth daily. 30 tablet 0   metFORMIN (GLUCOPHAGE) 500 MG tablet Take 1 tablet (500 mg total) by mouth 2 (two) times daily with a meal. 60 tablet 0   promethazine (PHENERGAN) 12.5 MG suppository Place 1 suppository (12.5 mg total) rectally every 6 (six) hours as needed for nausea or vomiting. 12 each 0   TRUEplus Lancets 28G MISC Use as directed 100 each 4   No current facility-administered medications on file prior to visit.    Allergies  Allergen Reactions   Codeine Nausea And Vomiting    Social History   Socioeconomic History   Marital status: Single    Spouse name: Not on file   Number of children: 2   Years of education: 12th   Highest education level: Not on file  Occupational History    Employer: [employer]  Tobacco Use   Smoking status: Never   Smokeless tobacco: Never  Vaping Use   Vaping Use: Never used  Substance and Sexual Activity   Alcohol use: Yes    Comment: socially   Drug use: No   Sexual activity: Not on file  Other Topics Concern   Not on file  Social History Narrative  Pt lives at home with family.   Caffeine Use: 1-2 cups occasionally   Social Determinants of Health   Financial Resource Strain: Not on file  Food Insecurity: Not on file  Transportation Needs: Not on file  Physical Activity: Not on file  Stress: Not on file  Social Connections: Not on file  Intimate Partner Violence: Not on file    Family History  Problem Relation Age of Onset   Dementia Mother    Bladder Cancer Father    Breast cancer Sister     Past Surgical History:  Procedure  Laterality Date   PARTIAL HYSTERECTOMY      ROS: Review of Systems Negative except as stated above  PHYSICAL EXAM: There were no vitals taken for this visit.  Physical Exam  {female adult master:310786} {female adult master:310785}  CMP Latest Ref Rng & Units 02/06/2021 11/07/2020 07/18/2020  Glucose 65 - 99 mg/dL 86 101(H) 98  BUN 8 - 27 mg/dL _0 Creatinine 0.57 - 1.00 mg/dL 0.65 0.81 0.67  Sodium 134 - 144 mmol/L 145(H) 141 139  Potassium 3.5 - 5.2 mmol/L 4.5 4.0 3.9  Chloride 96 - 106 mmol/L 104 102 102  CO2 20 - 29 mmol/L _1 Calcium 8.7 - 10.3 mg/dL 10.0 10.0 10.5(H)  Total Protein 6.0 - 8.5 g/dL - 7.6 7.7  Total Bilirubin 0.0 - 1.2 mg/dL - 0.3 0.3  Alkaline Phos 44 - 121 IU/L - 47 -  AST 0 - 40 IU/L - 22 18  ALT 0 - 32 IU/L - 23 18   Lipid Panel     Component Value Date/Time   CHOL 224 (H) 11/07/2020 1034   TRIG 150 (H) 11/07/2020 1034   HDL 53 11/07/2020 1034   CHOLHDL 4.2 11/07/2020 1034   LDLCALC 144 (H) 11/07/2020 1034    CBC    Component Value Date/Time   WBC 10.0 11/07/2020 1034   WBC 8.9 07/18/2020 1233   RBC 5.25 11/07/2020 1034   RBC 5.23 (H) 07/18/2020 1233   HGB 14.0 11/07/2020 1034   HCT 44.3 11/07/2020 1034   PLT 227 11/07/2020 1034   MCV 84 11/07/2020 1034   MCH 26.7 11/07/2020 1034   MCH 26.2 (L) 07/18/2020 1233   MCHC 31.6 11/07/2020 1034   MCHC 32.2 07/18/2020 1233   RDW 13.1 11/07/2020 1034   LYMPHSABS 2,492 07/18/2020 1233   MONOABS 0.5 09/01/2014 1735   EOSABS 80 07/18/2020 1233   BASOSABS 45 07/18/2020 1233    ASSESSMENT AND PLAN:  There are no diagnoses linked to this encounter.   Patient was given the opportunity to ask questions.  Patient verbalized understanding of the plan and was able to repeat key elements of the plan. Patient was given clear instructions to go to Emergency Department or return to medical center if symptoms don't improve, worsen, or new problems develop.The patient verbalized  understanding.   No orders of the defined types were placed in this encounter.    Requested Prescriptions    No prescriptions requested or ordered in this encounter    No follow-ups on file.  Camillia Herter, NP

## 2021-09-12 ENCOUNTER — Ambulatory Visit: Payer: Self-pay | Admitting: Family

## 2021-09-12 ENCOUNTER — Other Ambulatory Visit: Payer: Self-pay

## 2021-09-12 DIAGNOSIS — I10 Essential (primary) hypertension: Secondary | ICD-10-CM

## 2021-09-12 DIAGNOSIS — E119 Type 2 diabetes mellitus without complications: Secondary | ICD-10-CM

## 2021-09-14 ENCOUNTER — Other Ambulatory Visit: Payer: Self-pay

## 2021-11-25 ENCOUNTER — Other Ambulatory Visit: Payer: Self-pay | Admitting: Family

## 2021-11-25 DIAGNOSIS — I10 Essential (primary) hypertension: Secondary | ICD-10-CM

## 2021-11-27 ENCOUNTER — Other Ambulatory Visit: Payer: Self-pay

## 2021-11-27 MED ORDER — LISINOPRIL-HYDROCHLOROTHIAZIDE 20-12.5 MG PO TABS
1.0000 | ORAL_TABLET | Freq: Every day | ORAL | 0 refills | Status: DC
  Filled 2021-11-27 (×2): qty 30, 30d supply, fill #0

## 2021-11-27 MED ORDER — AMLODIPINE BESYLATE 5 MG PO TABS
5.0000 mg | ORAL_TABLET | Freq: Every day | ORAL | 0 refills | Status: AC
  Filled 2021-11-27 (×2): qty 30, 30d supply, fill #0

## 2021-11-27 NOTE — Telephone Encounter (Signed)
Schedule appointment?

## 2022-04-02 DIAGNOSIS — K219 Gastro-esophageal reflux disease without esophagitis: Secondary | ICD-10-CM | POA: Diagnosis not present

## 2022-04-02 DIAGNOSIS — Z23 Encounter for immunization: Secondary | ICD-10-CM | POA: Diagnosis not present

## 2022-04-02 DIAGNOSIS — I499 Cardiac arrhythmia, unspecified: Secondary | ICD-10-CM | POA: Diagnosis not present

## 2022-04-02 DIAGNOSIS — M5090 Cervical disc disorder, unspecified, unspecified cervical region: Secondary | ICD-10-CM | POA: Diagnosis not present

## 2022-04-02 DIAGNOSIS — Z0000 Encounter for general adult medical examination without abnormal findings: Secondary | ICD-10-CM | POA: Diagnosis not present

## 2022-04-02 DIAGNOSIS — E1169 Type 2 diabetes mellitus with other specified complication: Secondary | ICD-10-CM | POA: Diagnosis not present

## 2022-04-02 DIAGNOSIS — I10 Essential (primary) hypertension: Secondary | ICD-10-CM | POA: Diagnosis not present

## 2022-04-02 DIAGNOSIS — G43909 Migraine, unspecified, not intractable, without status migrainosus: Secondary | ICD-10-CM | POA: Diagnosis not present

## 2022-04-02 DIAGNOSIS — M79671 Pain in right foot: Secondary | ICD-10-CM | POA: Diagnosis not present

## 2022-04-02 DIAGNOSIS — M1990 Unspecified osteoarthritis, unspecified site: Secondary | ICD-10-CM | POA: Diagnosis not present

## 2022-04-02 DIAGNOSIS — J309 Allergic rhinitis, unspecified: Secondary | ICD-10-CM | POA: Diagnosis not present

## 2022-04-05 DIAGNOSIS — M25511 Pain in right shoulder: Secondary | ICD-10-CM | POA: Diagnosis not present

## 2022-04-05 DIAGNOSIS — M25571 Pain in right ankle and joints of right foot: Secondary | ICD-10-CM | POA: Diagnosis not present

## 2022-04-06 ENCOUNTER — Other Ambulatory Visit: Payer: Self-pay | Admitting: Internal Medicine

## 2022-04-06 DIAGNOSIS — E2839 Other primary ovarian failure: Secondary | ICD-10-CM

## 2022-04-13 DIAGNOSIS — M25571 Pain in right ankle and joints of right foot: Secondary | ICD-10-CM | POA: Diagnosis not present

## 2022-04-13 DIAGNOSIS — M25511 Pain in right shoulder: Secondary | ICD-10-CM | POA: Diagnosis not present

## 2022-04-13 DIAGNOSIS — M25612 Stiffness of left shoulder, not elsewhere classified: Secondary | ICD-10-CM | POA: Diagnosis not present

## 2022-04-13 DIAGNOSIS — R293 Abnormal posture: Secondary | ICD-10-CM | POA: Diagnosis not present

## 2022-04-20 DIAGNOSIS — M25612 Stiffness of left shoulder, not elsewhere classified: Secondary | ICD-10-CM | POA: Diagnosis not present

## 2022-04-20 DIAGNOSIS — M25511 Pain in right shoulder: Secondary | ICD-10-CM | POA: Diagnosis not present

## 2022-04-20 DIAGNOSIS — M25571 Pain in right ankle and joints of right foot: Secondary | ICD-10-CM | POA: Diagnosis not present

## 2022-04-20 DIAGNOSIS — R293 Abnormal posture: Secondary | ICD-10-CM | POA: Diagnosis not present

## 2022-04-26 NOTE — Progress Notes (Unsigned)
Cardiology Office Note:    Date:  04/30/2022   ID:  Terry [name], DOB 1956-08-14, MRN [id number]  PCP:  Camillia Herter, NP   [facility] Cardiologist:  None    Referring MD: Wenda Low, MD    History of Present Illness:    Terry [name] is a 66 y.o. female with a hx of HTN and migraines who was referred by Dr. Lysle Rubens for further evaluation of PVCs.  Patient was seen by Dr. Lysle Rubens in clinic for routine visit on 04/05/22. Note reviewed. Was incidentally found to be in ventricular bigeminy at that visit prompting referral to Cardiology for further management.   Today, the patient overall feels well. States she has had palpitations for years. No chest pain, lightheadedness, dizziness, SOB, LE edema, orthopnea, PND. She states she has slowed down some which she attributes to not being as active as she has been. She recently participated in rehab for her shoulder and she found she could get winded with activity. She will have to pause on a flight of stairs to catch her breath but no chest pain. Overall, her exercise capacity has significantly dropped.   Family History: Mother with heart disease (unknown details)  Past Medical History:  Diagnosis Date   Estrogen deficiency    Hypertension    Migraine     Past Surgical History:  Procedure Laterality Date   PARTIAL HYSTERECTOMY      Current Medications: Current Meds  Medication Sig   amitriptyline (ELAVIL) 10 MG tablet Take 10 mg by mouth at bedtime.   amLODipine (NORVASC) 5 MG tablet Take 1 tablet (5 mg total) by mouth daily.   Blood Glucose Monitoring Suppl (TRUE METRIX METER) w/Device KIT Use as directed   lisinopril-hydrochlorothiazide (ZESTORETIC) 20-12.5 MG tablet Take 1 tablet by mouth daily.   metFORMIN (GLUCOPHAGE) 500 MG tablet Take 1 tablet (500 mg total) by mouth 2 (two) times daily with a meal.   promethazine (PHENERGAN) 12.5 MG suppository Place 1 suppository (12.5 mg total) rectally every  6 (six) hours as needed for nausea or vomiting.   TRUEplus Lancets 28G MISC Use as directed     Allergies:   Codeine   Social History   Socioeconomic History   Marital status: Single    Spouse name: Not on file   Number of children: 2   Years of education: 12th   Highest education level: Not on file  Occupational History    Employer: [employer]  Tobacco Use   Smoking status: Never   Smokeless tobacco: Never  Vaping Use   Vaping Use: Never used  Substance and Sexual Activity   Alcohol use: Yes    Comment: socially   Drug use: No   Sexual activity: Not on file  Other Topics Concern   Not on file  Social History Narrative   Pt lives at home with family.   Caffeine Use: 1-2 cups occasionally   Social Determinants of Health   Financial Resource Strain: Not on file  Food Insecurity: Not on file  Transportation Needs: Not on file  Physical Activity: Not on file  Stress: Not on file  Social Connections: Not on file     Family History: The patient's family history includes Bladder Cancer in her father; Breast cancer in her sister; Dementia in her mother.  ROS:   Please see the history of present illness.     All other systems reviewed and are negative.  EKGs/Labs/Other Studies Reviewed:  The following studies were reviewed today: No CV studies  EKG:  EKG is  ordered today.  The ekg ordered today demonstrates NSR with frequent PVCs,  inferior q-waves  Recent Labs: No results found for requested labs within last 365 days.  Recent Lipid Panel    Component Value Date/Time   CHOL 224 (H) 11/07/2020 1034   TRIG 150 (H) 11/07/2020 1034   HDL 53 11/07/2020 1034   CHOLHDL 4.2 11/07/2020 1034   LDLCALC 144 (H) 11/07/2020 1034     Risk Assessment/Calculations:           Physical Exam:    VS:  BP 130/80 (BP Location: Left Arm, Patient Position: Sitting, Cuff Size: Normal)   Pulse 98   Ht '5\' 5"'  (4.098 m)   Wt 175 lb (79.4 kg)   SpO2 97%   BMI  29.12 kg/m     Wt Readings from Last 3 Encounters:  04/30/22 175 lb (79.4 kg)  07/31/21 170 lb (77.1 kg)  05/08/21 169 lb 3.2 oz (76.7 kg)     GEN:  Well nourished, well developed in no acute distress HEENT: Normal NECK: No JVD; No carotid bruits CARDIAC: Irregular, no murmurs RESPIRATORY:  Clear to auscultation without rales, wheezing or rhonchi  ABDOMEN: Soft, non-tender, non-distended MUSCULOSKELETAL:  No edema; No deformity  SKIN: Warm and dry NEUROLOGIC:  Alert and oriented x 3 PSYCHIATRIC:  Normal affect   ASSESSMENT:    1. PVC's (premature ventricular contractions)   2. Medication management   3. Hypertension, unspecified type   4. Type 2 diabetes mellitus with other specified complication, without long-term current use of insulin ([place])   5. SOB (shortness of breath)   6. Palpitations    PLAN:    In order of problems listed above:  #Ventricular Bigeminy: #Dyspnea on Exertion: #Inferior Q-waves Patient incidentally noted to be in ventricular bigeminy at PCP visit prompting referral here. Patient states palpitations have been ongoing for years but have not been overly bothersome. Notably, she does report progressive dyspnea on exertion with increased fatigue such that she cannot make it up a flight of stairs without needing to catch her breath. No orthopnea, PND, LE edema and appears euvolemic on exam with no other HF symptoms. ECG today with frequent PVCs and inferior q-waves. Given frequent ventricular ectopy, SOB and inferior q-waves, will plan for ischemic work-up, cardiac monitor and TTE. -Check Myoview -Check zio monitor -Check TTE -Check TSH, BMET, Mg  #HTN: Controlled at home. -Continue amlodipine 79m daily -Continue lisinopril-HCTZ 20-12.527mdaily  #DMII: -Continue metformin  #CV Risk Stratification: -Check lipid panel and LFTs      Shared Decision Making/Informed Consent The risks [chest pain, shortness of breath, cardiac arrhythmias, dizziness,  blood pressure fluctuations, myocardial infarction, stroke/transient ischemic attack, nausea, vomiting, allergic reaction, radiation exposure, metallic taste sensation and life-threatening complications (estimated to be 1 in 10,000)], benefits (risk stratification, diagnosing coronary artery disease, treatment guidance) and alternatives of a nuclear stress test were discussed in detail with Ms. AlFriernd she agrees to proceed.    Medication Adjustments/Labs and Tests Ordered: Current medicines are reviewed at length with the patient today.  Concerns regarding medicines are outlined above.  Orders Placed This Encounter  Procedures   TSH   Comp Met (CMET)   Lipid Profile   Magnesium   LONG TERM MONITOR (3-14 DAYS)   MYOCARDIAL PERFUSION IMAGING   EKG 12-Lead   ECHOCARDIOGRAM COMPLETE   No orders of the defined types were placed in this  encounter.   Patient Instructions  Medication Instructions:   Your physician recommends that you continue on your current medications as directed. Please refer to the Current Medication list given to you today.  *If you need a refill on your cardiac medications before your next appointment, please call your pharmacy*   Lab Work:  THIS [facility] 05/02/22 HERE IN THE OFFICE--CMET, TSH LEVEL, MAGNESIUM LEVEL, AND LIPIDS  If you have labs (blood work) drawn today and your tests are completely normal, you will receive your results only by: [place] (if you have MyChart) OR A paper copy in the mail If you have any lab test that is abnormal or we need to change your treatment, we will call you to review the results.   Testing/Procedures:  Your physician has requested that you have an echocardiogram. Echocardiography is a painless test that uses sound waves to create images of your heart. It provides your doctor with information about the size and shape of your heart and how well your heart's chambers and valves are working. This procedure takes  approximately one hour. There are no restrictions for this procedure.  Your physician has requested that you have a lexiscan myoview. For further information please visit [email or web address]. Please follow instruction sheet, as given.   ZIO XT- Long Term Monitor Instructions  Your physician has requested you wear a ZIO patch monitor for 3  days.  This is a single patch monitor. [employer] supplies one patch monitor per enrollment. Additional stickers are not available. Please do not apply patch if you will be having a Nuclear Stress Test,  Echocardiogram, Cardiac CT, MRI, or Chest Xray during the period you would be wearing the  monitor. The patch cannot be worn during these tests. You cannot remove and re-apply the  ZIO XT patch monitor.  Your ZIO patch monitor will be mailed 3 day USPS to your address on file. It may take 3-5 days  to receive your monitor after you have been enrolled.  Once you have received your monitor, please review the enclosed instructions. Your monitor  has already been registered assigning a specific monitor serial # to you.  Billing and Patient Assistance Program Information  We have supplied [employer] with any of your insurance information on file for billing purposes. [employer] offers a sliding scale Patient Assistance Program for patients that do not have  insurance, or whose insurance does not completely cover the cost of the ZIO monitor.  You must apply for the Patient Assistance Program to qualify for this discounted rate.  To apply, please call [employer] at [phone], select option 4, select option 2, ask to apply for  Patient Assistance Program. Theodore Demark will ask your household income, and how many people  are in your household. They will quote your out-of-pocket cost based on that information.  [employer] will also be able to set up a 53-month interest-free payment plan if needed.  Applying the monitor   Shave hair from upper left chest.  Hold abrader disc  by orange tab. Rub abrader in 40 strokes over the upper left chest as  indicated in your monitor instructions.  Clean area with 4 enclosed alcohol pads. Let dry.  Apply patch as indicated in monitor instructions. Patch will be placed under collarbone on left  side of chest with arrow pointing upward.  Rub patch adhesive wings for 2 minutes. Remove white label marked "1". Remove the white  label marked "2". Rub patch adhesive wings for 2 additional minutes.  While looking  in a mirror, press and release button in center of patch. A small green light will  flash 3-4 times. This will be your only indicator that the monitor has been turned on.  Do not shower for the first 24 hours. You may shower after the first 24 hours.  Press the button if you feel a symptom. You will hear a small click. Record Date, Time and  Symptom in the Patient Logbook.  When you are ready to remove the patch, follow instructions on the last 2 pages of Patient  Logbook. Stick patch monitor onto the last page of Patient Logbook.  Place Patient Logbook in the blue and white box. Use locking tab on box and tape box closed  securely. The blue and white box has prepaid postage on it. Please place it in the mailbox as  soon as possible. Your physician should have your test results approximately 7 days after the  monitor has been mailed back to [facility].  Call [facility] at [phone] if you have questions regarding  your ZIO XT patch monitor. Call them immediately if you see an orange light blinking on your  monitor.  If your monitor falls off in less than 4 days, contact our Monitor department at [phone].  If your monitor becomes loose or falls off after 4 days call [employer] at [phone] for  suggestions on securing your monitor    Follow-Up: At [facility], you and your health needs are our priority.  As part of our continuing mission to provide you with exceptional heart care, we  have created designated Provider Care Teams.  These Care Teams include your primary Cardiologist (physician) and Advanced Practice Providers (APPs -  Physician Assistants and Nurse Practitioners) who all work together to provide you with the care you need, when you need it.  We recommend signing up for the patient portal called "MyChart".  Sign up information is provided on this After Visit Summary.  MyChart is used to connect with patients for Virtual Visits (Telemedicine).  Patients are able to view lab/test results, encounter notes, upcoming appointments, etc.  Non-urgent messages can be sent to your provider as well.   To learn more about what you can do with MyChart, go to [email or web address].    Your next appointment:   6 month(s)  The format for your next appointment:   In Person  Provider:   Robbie Lis, PA-C, Nicholes Rough, PA-C, Melina Copa, PA-C, Ambrose Pancoast, NP, Cecilie Kicks, NP, Ermalinda Barrios, PA-C, Christen Bame, NP, or Richardson Dopp, PA-C     {   Important Information About Sugar         Signed, Freada Bergeron, MD  04/30/2022 9:18 AM    [place]

## 2022-04-30 ENCOUNTER — Encounter: Payer: Self-pay | Admitting: Cardiology

## 2022-04-30 ENCOUNTER — Encounter: Payer: Self-pay | Admitting: *Deleted

## 2022-04-30 ENCOUNTER — Ambulatory Visit (INDEPENDENT_AMBULATORY_CARE_PROVIDER_SITE_OTHER): Payer: Medicare Other

## 2022-04-30 ENCOUNTER — Ambulatory Visit: Payer: Medicare Other | Admitting: Cardiology

## 2022-04-30 ENCOUNTER — Telehealth: Payer: Self-pay | Admitting: *Deleted

## 2022-04-30 VITALS — BP 130/80 | HR 98 | Ht 65.0 in | Wt 175.0 lb

## 2022-04-30 DIAGNOSIS — R0602 Shortness of breath: Secondary | ICD-10-CM

## 2022-04-30 DIAGNOSIS — I10 Essential (primary) hypertension: Secondary | ICD-10-CM

## 2022-04-30 DIAGNOSIS — R002 Palpitations: Secondary | ICD-10-CM

## 2022-04-30 DIAGNOSIS — E1169 Type 2 diabetes mellitus with other specified complication: Secondary | ICD-10-CM | POA: Diagnosis not present

## 2022-04-30 DIAGNOSIS — I493 Ventricular premature depolarization: Secondary | ICD-10-CM

## 2022-04-30 DIAGNOSIS — Z79899 Other long term (current) drug therapy: Secondary | ICD-10-CM | POA: Diagnosis not present

## 2022-04-30 NOTE — Telephone Encounter (Signed)
-----   Message from Ernst Bowler sent at 04/30/2022 12:49 PM EDT ----- Regarding: RE: 3 DAY ZIO PER DR. Shari Prows done ----- Message ----- From: Loa Socks, LPN Sent: 11/18/5595   9:10 AM EDT To: Loa Socks, LPN; Shelly A Wells; # Subject: 3 DAY ZIO PER DR. Shari Prows                    Dr. Shari Prows ordered a 3 day zio for palp and PVCs. Order is in.  Please enroll and let me know when you do.  Thanks [employer]

## 2022-04-30 NOTE — Progress Notes (Unsigned)
z

## 2022-04-30 NOTE — Patient Instructions (Signed)
Medication Instructions:   Your physician recommends that you continue on your current medications as directed. Please refer to the Current Medication list given to you today.  *If you need a refill on your cardiac medications before your next appointment, please call your pharmacy*   Lab Work:  THIS [facility] 05/02/22 HERE IN THE OFFICE--CMET, TSH LEVEL, MAGNESIUM LEVEL, AND LIPIDS  If you have labs (blood work) drawn today and your tests are completely normal, you will receive your results only by: MyChart [place] (if you have MyChart) OR A paper copy in the mail If you have any lab test that is abnormal or we need to change your treatment, we will call you to review the results.   Testing/Procedures:  Your physician has requested that you have an echocardiogram. Echocardiography is a painless test that uses sound waves to create images of your heart. It provides your doctor with information about the size and shape of your heart and how well your heart's chambers and valves are working. This procedure takes approximately one hour. There are no restrictions for this procedure.  Your physician has requested that you have a lexiscan myoview. For further information please visit [email or web address] Please follow instruction sheet, as given.   ZIO XT- Long Term Monitor Instructions  Your physician has requested you wear a ZIO patch monitor for 3  days.  This is a single patch monitor. [employer] supplies one patch monitor per enrollment. Additional stickers are not available. Please do not apply patch if you will be having a Nuclear Stress Test,  Echocardiogram, Cardiac CT, MRI, or Chest Xray during the period you would be wearing the  monitor. The patch cannot be worn during these tests. You cannot remove and re-apply the  ZIO XT patch monitor.  Your ZIO patch monitor will be mailed 3 day USPS to your address on file. It may take 3-5 days  to receive your monitor after you have been  enrolled.  Once you have received your monitor, please review the enclosed instructions. Your monitor  has already been registered assigning a specific monitor serial # to you.  Billing and Patient Assistance Program Information  We have supplied [employer] with any of your insurance information on file for billing purposes. [employer] offers a sliding scale Patient Assistance Program for patients that do not have  insurance, or whose insurance does not completely cover the cost of the ZIO monitor.  You must apply for the Patient Assistance Program to qualify for this discounted rate.  To apply, please call [employer] at [phone], select option 4, select option 2, ask to apply for  Patient Assistance Program. Meredeth Ide will ask your household income, and how many people  are in your household. They will quote your out-of-pocket cost based on that information.  [employer] will also be able to set up a 40-month, interest-free payment plan if needed.  Applying the monitor   Shave hair from upper left chest.  Hold abrader disc by orange tab. Rub abrader in 40 strokes over the upper left chest as  indicated in your monitor instructions.  Clean area with 4 enclosed alcohol pads. Let dry.  Apply patch as indicated in monitor instructions. Patch will be placed under collarbone on left  side of chest with arrow pointing upward.  Rub patch adhesive wings for 2 minutes. Remove white label marked "1". Remove the white  label marked "2". Rub patch adhesive wings for 2 additional minutes.  While looking in a mirror, press and release button  in center of patch. A small green light will  flash 3-4 times. This will be your only indicator that the monitor has been turned on.  Do not shower for the first 24 hours. You may shower after the first 24 hours.  Press the button if you feel a symptom. You will hear a small click. Record Date, Time and  Symptom in the Patient Logbook.  When you are ready to remove the  patch, follow instructions on the last 2 pages of Patient  Logbook. Stick patch monitor onto the last page of Patient Logbook.  Place Patient Logbook in the blue and white box. Use locking tab on box and tape box closed  securely. The blue and white box has prepaid postage on it. Please place it in the mailbox as  soon as possible. Your physician should have your test results approximately 7 days after the  monitor has been mailed back to [facility].  Call [facility] Customer Care at [phone] if you have questions regarding  your ZIO XT patch monitor. Call them immediately if you see an orange light blinking on your  monitor.  If your monitor falls off in less than 4 days, contact our Monitor department at [phone].  If your monitor becomes loose or falls off after 4 days call [employer] at [phone] for  suggestions on securing your monitor    Follow-Up: At [facility], you and your health needs are our priority.  As part of our continuing mission to provide you with exceptional heart care, we have created designated Provider Care Teams.  These Care Teams include your primary Cardiologist (physician) and Advanced Practice Providers (APPs -  Physician Assistants and Nurse Practitioners) who all work together to provide you with the care you need, when you need it.  We recommend signing up for the patient portal called "MyChart".  Sign up information is provided on this After Visit Summary.  MyChart is used to connect with patients for Virtual Visits (Telemedicine).  Patients are able to view lab/test results, encounter notes, upcoming appointments, etc.  Non-urgent messages can be sent to your provider as well.   To learn more about what you can do with MyChart, go to [email or web address].    Your next appointment:   6 month(s)  The format for your next appointment:   In Person  Provider:   Chelsea Aus, PA-C, Jari Favre, PA-C, Ronie Spies, PA-C, Robin Searing, NP,  Nada Boozer, NP, Jacolyn Reedy, PA-C, Eligha Bridegroom, NP, or Tereso Newcomer, PA-C     {   Important Information About Sugar

## 2022-05-01 ENCOUNTER — Other Ambulatory Visit: Payer: Self-pay | Admitting: Internal Medicine

## 2022-05-01 DIAGNOSIS — Z1231 Encounter for screening mammogram for malignant neoplasm of breast: Secondary | ICD-10-CM

## 2022-05-02 ENCOUNTER — Other Ambulatory Visit: Payer: Medicare Other

## 2022-05-02 DIAGNOSIS — R0602 Shortness of breath: Secondary | ICD-10-CM

## 2022-05-02 DIAGNOSIS — Z79899 Other long term (current) drug therapy: Secondary | ICD-10-CM | POA: Diagnosis not present

## 2022-05-02 DIAGNOSIS — R002 Palpitations: Secondary | ICD-10-CM

## 2022-05-02 DIAGNOSIS — I493 Ventricular premature depolarization: Secondary | ICD-10-CM | POA: Diagnosis not present

## 2022-05-02 DIAGNOSIS — E1169 Type 2 diabetes mellitus with other specified complication: Secondary | ICD-10-CM | POA: Diagnosis not present

## 2022-05-02 DIAGNOSIS — I10 Essential (primary) hypertension: Secondary | ICD-10-CM | POA: Diagnosis not present

## 2022-05-02 LAB — COMPREHENSIVE METABOLIC PANEL
ALT: 18 IU/L (ref 0–32)
AST: 19 IU/L (ref 0–40)
Albumin/Globulin Ratio: 1.6 (ref 1.2–2.2)
Albumin: 4.4 g/dL (ref 3.9–4.9)
Alkaline Phosphatase: 44 IU/L (ref 44–121)
BUN/Creatinine Ratio: 16 (ref 12–28)
BUN: 12 mg/dL (ref 8–27)
Bilirubin Total: 0.2 mg/dL (ref 0.0–1.2)
CO2: 24 mmol/L (ref 20–29)
Calcium: 9.6 mg/dL (ref 8.7–10.3)
Chloride: 104 mmol/L (ref 96–106)
Creatinine, Ser: 0.73 mg/dL (ref 0.57–1.00)
Globulin, Total: 2.7 g/dL (ref 1.5–4.5)
Glucose: 109 mg/dL — ABNORMAL HIGH (ref 70–99)
Potassium: 3.7 mmol/L (ref 3.5–5.2)
Sodium: 142 mmol/L (ref 134–144)
Total Protein: 7.1 g/dL (ref 6.0–8.5)
eGFR: 91 mL/min/{1.73_m2} (ref >59)

## 2022-05-02 LAB — LIPID PANEL
Chol/HDL Ratio: 3.6 ratio (ref 0.0–4.4)
Cholesterol, Total: 204 mg/dL — ABNORMAL HIGH (ref 100–199)
HDL: 57 mg/dL (ref >39)
LDL Chol Calc (NIH): 132 mg/dL — ABNORMAL HIGH (ref 0–99)
Triglycerides: 84 mg/dL (ref 0–149)
VLDL Cholesterol Cal: 15 mg/dL (ref 5–40)

## 2022-05-02 LAB — TSH: TSH: 2.01 u[IU]/mL (ref 0.450–4.500)

## 2022-05-02 LAB — MAGNESIUM: Magnesium: 1.7 mg/dL (ref 1.6–2.3)

## 2022-05-03 ENCOUNTER — Telehealth: Payer: Self-pay | Admitting: *Deleted

## 2022-05-03 MED ORDER — MAGNESIUM 200 MG PO TABS: 200.0000 mg | ORAL_TABLET | Freq: Every day | ORAL | Status: AC

## 2022-05-03 MED ORDER — ROSUVASTATIN CALCIUM 10 MG PO TABS: 10.0000 mg | ORAL_TABLET | Freq: Every day | ORAL | 1 refills | Status: DC

## 2022-05-03 NOTE — Telephone Encounter (Signed)
-----   Message from Meriam Sprague, MD sent at 05/03/2022 12:09 PM EDT ----- Her kidney function and liver function look good. Her Magnesium is a little on the lower side. She may want to start an OTC supplement 200mg  at night to help boost it or she can try increasing magnesium rich foods in her diet.  Her cholesterol is elevated with LDL 132. Is she willing to start crestor 10mg  daily to help lower it?

## 2022-05-03 NOTE — Telephone Encounter (Signed)
The patient has been notified of the result and verbalized understanding.  All questions (if any) were answered.  Pt agreed to start taking crestor 10 mg po daily. She will also purchase OTC Magnesium 200 mg po daily. Confirmed the pharmacy of choice with the pt. Pt verbalized understanding and agrees with this plan.

## 2022-05-09 DIAGNOSIS — R002 Palpitations: Secondary | ICD-10-CM | POA: Diagnosis not present

## 2022-05-09 DIAGNOSIS — I493 Ventricular premature depolarization: Secondary | ICD-10-CM

## 2022-05-09 DIAGNOSIS — R0602 Shortness of breath: Secondary | ICD-10-CM | POA: Diagnosis not present

## 2022-05-15 ENCOUNTER — Telehealth (HOSPITAL_COMMUNITY): Payer: Self-pay | Admitting: Radiology

## 2022-05-15 NOTE — Telephone Encounter (Signed)
Patient given detailed instructions per Myocardial Perfusion Study Information Sheet for the test on 8/7 at 7:15. Patient notified to arrive 15 minutes early and that it is imperative to arrive on time for appointment to keep from having the test rescheduled.  If you need to cancel or reschedule your appointment, please call the office within 24 hours of your appointment. . Patient verbalized understanding.EHK

## 2022-05-18 DIAGNOSIS — R0602 Shortness of breath: Secondary | ICD-10-CM | POA: Diagnosis not present

## 2022-05-18 DIAGNOSIS — I493 Ventricular premature depolarization: Secondary | ICD-10-CM | POA: Diagnosis not present

## 2022-05-18 DIAGNOSIS — R002 Palpitations: Secondary | ICD-10-CM | POA: Diagnosis not present

## 2022-05-21 ENCOUNTER — Ambulatory Visit (HOSPITAL_COMMUNITY): Payer: Medicare Other | Attending: Cardiovascular Disease

## 2022-05-21 ENCOUNTER — Other Ambulatory Visit: Payer: Self-pay | Admitting: Cardiology

## 2022-05-21 ENCOUNTER — Ambulatory Visit (HOSPITAL_BASED_OUTPATIENT_CLINIC_OR_DEPARTMENT_OTHER): Payer: Medicare Other

## 2022-05-21 ENCOUNTER — Telehealth: Payer: Self-pay | Admitting: Cardiology

## 2022-05-21 DIAGNOSIS — R0602 Shortness of breath: Secondary | ICD-10-CM | POA: Diagnosis not present

## 2022-05-21 DIAGNOSIS — R002 Palpitations: Secondary | ICD-10-CM

## 2022-05-21 DIAGNOSIS — I493 Ventricular premature depolarization: Secondary | ICD-10-CM | POA: Diagnosis not present

## 2022-05-21 LAB — ECHOCARDIOGRAM COMPLETE
Area-P 1/2: 3.21 cm2
Height: 65 in
S' Lateral: 3.5 cm
Weight: 2800 oz

## 2022-05-21 LAB — MYOCARDIAL PERFUSION IMAGING
LV dias vol: 136 mL (ref 46–106)
LV sys vol: 68 mL
Nuc Stress EF: 50 %
Peak HR: 114 {beats}/min
Rest HR: 89 {beats}/min
Rest Nuclear Isotope Dose: 10.8 mCi
SDS: 0
SRS: 0
SSS: 0
ST Depression (mm): 0 mm
Stress Nuclear Isotope Dose: 31.3 mCi
TID: 1.03

## 2022-05-21 MED ORDER — TECHNETIUM TC 99M TETROFOSMIN IV KIT
31.3000 | PACK | Freq: Once | INTRAVENOUS | Status: AC | PRN
Start: 2022-05-21 — End: 2022-05-21
  Administered 2022-05-21: 31.3 via INTRAVENOUS

## 2022-05-21 MED ORDER — REGADENOSON 0.4 MG/5ML IV SOLN
0.4000 mg | Freq: Once | INTRAVENOUS | Status: AC
  Administered 2022-05-21: 0.4 mg via INTRAVENOUS

## 2022-05-21 MED ORDER — METOPROLOL SUCCINATE ER 25 MG PO TB24: 25.0000 mg | ORAL_TABLET | Freq: Two times a day (BID) | ORAL | 3 refills | Status: DC

## 2022-05-21 MED ORDER — TECHNETIUM TC 99M TETROFOSMIN IV KIT
10.8000 | PACK | Freq: Once | INTRAVENOUS | Status: AC | PRN
  Administered 2022-05-21: 10.8 via INTRAVENOUS

## 2022-05-21 NOTE — Telephone Encounter (Signed)
Called and spoke to the patient about her monitor results, echo and stress test. Given burden of PVCs (38%), will start metop 25mg  XL daily which she will increase to 50mg  XL daily (or 25mg  XL BID) after 3 days if she feels okay on the medication and does not have low blood pressures/HR. I have sent in the script to [facility].   Can we have her scheduled to see EP as well? She is aware we will be calling her to get this scheduled.  Thank you so much!  , Md

## 2022-05-21 NOTE — Addendum Note (Signed)
Addended by: Alvin Critchley A on: 05/21/2022 04:03 PM   Modules accepted: Orders

## 2022-05-22 ENCOUNTER — Telehealth: Payer: Self-pay

## 2022-05-22 NOTE — Telephone Encounter (Signed)
-----   Message from Meriam Sprague, MD sent at 05/22/2022  2:15 PM EDT ----- Called and spoke to the patient about the results. Sent in script for metoprolol BID. Will refer to EP.

## 2022-05-22 NOTE — Telephone Encounter (Signed)
See note below. Referral has been placed and medication sent in.

## 2022-05-25 DIAGNOSIS — M25511 Pain in right shoulder: Secondary | ICD-10-CM | POA: Diagnosis not present

## 2022-05-25 DIAGNOSIS — M25561 Pain in right knee: Secondary | ICD-10-CM | POA: Diagnosis not present

## 2022-06-22 ENCOUNTER — Encounter: Payer: Self-pay | Admitting: Nurse Practitioner

## 2022-07-04 ENCOUNTER — Encounter: Payer: Self-pay | Admitting: Internal Medicine

## 2022-07-04 ENCOUNTER — Ambulatory Visit: Payer: Medicare Other | Attending: Internal Medicine | Admitting: Internal Medicine

## 2022-07-04 ENCOUNTER — Ambulatory Visit: Payer: Medicare Other | Attending: Internal Medicine

## 2022-07-04 VITALS — BP 124/70 | HR 85 | Ht 65.0 in | Wt 172.8 lb

## 2022-07-04 DIAGNOSIS — I493 Ventricular premature depolarization: Secondary | ICD-10-CM

## 2022-07-04 DIAGNOSIS — I10 Essential (primary) hypertension: Secondary | ICD-10-CM

## 2022-07-04 NOTE — Progress Notes (Unsigned)
Enrolled for [employer] to deliver a ZIO XT long term holter monitor February,1, 2024.to the patients address on file.   Patient to wear the monitor in February 2024.

## 2022-07-04 NOTE — Progress Notes (Signed)
HPI [name] is referred by Dr. Johney [name] for evaluation of PVC's. The patient is a pleasnt 66 yo woman with HTN and DM who was noted to have an irregular HR and an ECG showed NSR with ventricular trigeminy. She had a 2D echo which showed preserved LV function. She wore a cardiac monitor which demonstrated NSR with 38% PVC's. No sustained VT. She was placed on a beta blocker and is referred for additional evaluation. She denies syncope. No chest pain. She can tell she has PVC's though does not complain much. She thinks that the PVC's have improved since starting her beta blocker.  Allergies  Allergen Reactions   Codeine Nausea And Vomiting     Current Outpatient Medications  Medication Sig Dispense Refill   amitriptyline (ELAVIL) 10 MG tablet Take 10 mg by mouth at bedtime.     amLODipine (NORVASC) 5 MG tablet Take 1 tablet (5 mg total) by mouth daily. 30 tablet 0   Blood Glucose Monitoring Suppl (TRUE METRIX METER) w/Device KIT Use as directed 1 kit 0   lisinopril-hydrochlorothiazide (ZESTORETIC) 20-12.5 MG tablet Take 1 tablet by mouth daily. 30 tablet 0   Magnesium 200 MG TABS Take 1 tablet (200 mg total) by mouth at bedtime. 30 tablet    metFORMIN (GLUCOPHAGE) 500 MG tablet Take 1 tablet (500 mg total) by mouth 2 (two) times daily with a meal. 60 tablet 0   metoprolol succinate (TOPROL XL) 25 MG 24 hr tablet Take 1 tablet (25 mg total) by mouth 2 (two) times daily. 180 tablet 3   promethazine (PHENERGAN) 12.5 MG suppository Place 1 suppository (12.5 mg total) rectally every 6 (six) hours as needed for nausea or vomiting. 12 each 0   rosuvastatin (CRESTOR) 10 MG tablet Take 1 tablet (10 mg total) by mouth daily. 90 tablet 1   TRUEplus Lancets 28G MISC Use as directed 100 each 4   No current facility-administered medications for this visit.     Past Medical History:  Diagnosis Date   Estrogen deficiency    Hypertension    Migraine     ROS:   All systems reviewed and  negative except as noted in the HPI.   Past Surgical History:  Procedure Laterality Date   PARTIAL HYSTERECTOMY       Family History  Problem Relation Age of Onset   Dementia Mother    Bladder Cancer Father    Breast cancer Sister      Social History   Socioeconomic History   Marital status: Single    Spouse name: Not on file   Number of children: 2   Years of education: 12th   Highest education level: Not on file  Occupational History    Employer: [employer]  Tobacco Use   Smoking status: Never   Smokeless tobacco: Never  Vaping Use   Vaping Use: Never used  Substance and Sexual Activity   Alcohol use: Yes    Comment: socially   Drug use: No   Sexual activity: Not on file  Other Topics Concern   Not on file  Social History Narrative   Pt lives at home with family.   Caffeine Use: 1-2 cups occasionally   Social Determinants of Health   Financial Resource Strain: Not on file  Food Insecurity: Not on file  Transportation Needs: Not on file  Physical Activity: Not on file  Stress: Not on file  Social Connections: Not on file  Intimate Partner Violence:  Not on file     BP 124/70   Pulse 85   Ht _0  (1.651 m)   Wt 172 lb 12.8 oz (78.4 kg)   SpO2 99%   BMI 28.76 kg/m   Physical Exam:  Well appearing middle aged woman, NAD HEENT: Unremarkable Neck:  6 cm JVD, no thyromegally Lymphatics:  No adenopathy Back:  No CVA tenderness Lungs:  Clear with no wheezes HEART:  IRegular rate rhythm, no murmurs, no rubs, no clicks Abd:  soft, positive bowel sounds, no organomegally, no rebound, no guarding Ext:  2 plus pulses, no edema, no cyanosis, no clubbing Skin:  No rashes no nodules Neuro:  CN II through XII intact, motor grossly intact  EKG - nsr  Assess/Plan:  PVC's - it appears that her PVC's are much improved. I listened for 30 seconds and did not hear a PVC. I have recommended she continue her beta blocker and that she undergo a  repeat Zio monitor for 3 days in February. If her symptoms worsen, I'll see her back sooner. If the Zio is reassuring then nothing more needs to be done other than continued beta blocker.  HTN -her bp is well controlled.  Terry [name] Terry Varone,MD

## 2022-07-04 NOTE — Patient Instructions (Addendum)
Medication Instructions:  Your physician recommends that you continue on your current medications as directed. Please refer to the Current Medication list given to you today.  *If you need a refill on your cardiac medications before your next appointment, please call your pharmacy*  Lab Work: None ordered.  Testing/Procedures:  3 DAY ZIO MONITOR ORDERED FOR FEBRUARY 2024.  ( SEE NOTE BELOW. )  Follow-Up: At [facility], you and your health needs are our priority.  As part of our continuing mission to provide you with exceptional heart care, we have created designated Provider Care Teams.  These Care Teams include your primary Cardiologist (physician) and Advanced Practice Providers (APPs -  Physician Assistants and Nurse Practitioners) who all work together to provide you with the care you need, when you need it.  We recommend signing up for the patient portal called "MyChart".  Sign up information is provided on this After Visit Summary.  MyChart is used to connect with patients for Virtual Visits (Telemedicine).  Patients are able to view lab/test results, encounter notes, upcoming appointments, etc.  Non-urgent messages can be sent to your provider as well.   To learn more about what you can do with MyChart, go to [email or web address].    PLEASE SCHEDULE PATIENT FOR 3 DAY ZIO MONITOR TO ASSESS PVC'S IN FEBRUARY 2024;   PLEASE SCHEDULE PATIENT FOR FOLLOW UP APPOINTMENT WITH DR. GREGG TAYLOR IN MARCH 2024, AFTER ZIO MONITOR RESULTS ARE OBTAINED.    ZIO XT- Long Term Monitor Instructions  Your physician has requested you wear a ZIO patch monitor for 14 days.  This is a single patch monitor. [employer] supplies one patch monitor per enrollment. Additional stickers are not available. Please do not apply patch if you will be having a Nuclear Stress Test,  Echocardiogram, Cardiac CT, MRI, or Chest Xray during the period you would be wearing the  monitor. The patch cannot be worn during  these tests. You cannot remove and re-apply the  ZIO XT patch monitor.  Your ZIO patch monitor will be mailed 3 day USPS to your address on file. It may take 3-5 days  to receive your monitor after you have been enrolled.  Once you have received your monitor, please review the enclosed instructions. Your monitor  has already been registered assigning a specific monitor serial # to you.  Billing and Patient Assistance Program Information  We have supplied [employer] with any of your insurance information on file for billing purposes. [employer] offers a sliding scale Patient Assistance Program for patients that do not have  insurance, or whose insurance does not completely cover the cost of the ZIO monitor.  You must apply for the Patient Assistance Program to qualify for this discounted rate.  To apply, please call [employer] at [phone], select option 4, select option 2, ask to apply for  Patient Assistance Program. Theodore Demark will ask your household income, and how many people  are in your household. They will quote your out-of-pocket cost based on that information.  [employer] will also be able to set up a 87-month, interest-free payment plan if needed.  Applying the monitor   Shave hair from upper left chest.  Hold abrader disc by orange tab. Rub abrader in 40 strokes over the upper left chest as  indicated in your monitor instructions.  Clean area with 4 enclosed alcohol pads. Let dry.  Apply patch as indicated in monitor instructions. Patch will be placed under collarbone on left  side of chest with arrow pointing  upward.  Rub patch adhesive wings for 2 minutes. Remove white label marked "1". Remove the white  label marked "2". Rub patch adhesive wings for 2 additional minutes.  While looking in a mirror, press and release button in center of patch. A small green light will  flash 3-4 times. This will be your only indicator that the monitor has been turned on.  Do not shower for the first 24  hours. You may shower after the first 24 hours.  Press the button if you feel a symptom. You will hear a small click. Record Date, Time and  Symptom in the Patient Logbook.  When you are ready to remove the patch, follow instructions on the last 2 pages of Patient  Logbook. Stick patch monitor onto the last page of Patient Logbook.  Place Patient Logbook in the blue and white box. Use locking tab on box and tape box closed  securely. The blue and white box has prepaid postage on it. Please place it in the mailbox as  soon as possible. Your physician should have your test results approximately 7 days after the  monitor has been mailed back to [facility].  Call [employer] at [phone] if you have questions regarding  your ZIO XT patch monitor. Call them immediately if you see an orange light blinking on your  monitor.  If your monitor falls off in less than 4 days, contact our Monitor department at [phone].  If your monitor becomes loose or falls off after 4 days call [employer] at [phone] for  suggestions on securing your monitor

## 2022-07-30 ENCOUNTER — Encounter: Payer: Self-pay | Admitting: Nurse Practitioner

## 2022-07-30 ENCOUNTER — Ambulatory Visit: Payer: Medicare Other | Admitting: Nurse Practitioner

## 2022-07-30 VITALS — BP 132/76 | HR 90 | Ht 65.0 in | Wt 170.0 lb

## 2022-07-30 DIAGNOSIS — K219 Gastro-esophageal reflux disease without esophagitis: Secondary | ICD-10-CM

## 2022-07-30 DIAGNOSIS — Z86010 Personal history of colonic polyps: Secondary | ICD-10-CM | POA: Diagnosis not present

## 2022-07-30 MED ORDER — NA SULFATE-K SULFATE-MG SULF 17.5-3.13-1.6 GM/177ML PO SOLN: 1.0000 | Freq: Once | ORAL | 0 refills | Status: AC

## 2022-07-30 NOTE — Progress Notes (Signed)
____________________________________________________________  Attending physician addendum:  Thank you for sending this case to me. I have reviewed the entire note and agree with the plan.   Laken Lobato Danis, MD  ____________________________________________________________  

## 2022-07-30 NOTE — Progress Notes (Addendum)
07/30/2022 Terry [name] [id number] November 03, 1955   CHIEF COMPLAINT: Heartburn and diarrhea  HISTORY OF PRESENT ILLNESS: Terry [name] is a 66 year old female with a past medical history of hypertension, PVCs, DM II, migraine headaches, GERD and colon polyps. Partial hysterectomy.  She presents to our office today as referred by Dr. Wenda Low for further evaluation regarding acid reflux.  She endorsed having nighttime acid reflux for 2 to 3 years which improved after she started taking Omeprazole 20 or 40 mg as prescribed by Dr. Lysle Rubens 03/2022.  No dysphagia.  She denies ever having an EGD.  She complains of having intermittent bouts of diarrhea which last for 1 day at a time and occurs 2 to 3 days monthly.  No bloody diarrhea.  No specific food triggers.  She has nausea without vomiting on days she has diarrhea.  She takes Pepto-Bismol for nausea and diarrhea which results in passing 1 or 2 black stools.  She denies seeing any black stools when not using Pepto.  She takes Imodium as needed as well.  She has upper and lower abdominal cramping discomfort on the day she has diarrhea.  No severe abdominal pain.  She passes a normal formed brown bowel movement on the day she does not have diarrhea.  She complains of having abdominal bloat which she thinks is worse after she consumes dairy products. She was previously followed by [facility].  She wishes to transition her GI management to [facility] gastroenterology.  She underwent 2 colonoscopies in her lifetime.  She stated the first colonoscopy around age 61 showed 1 polyp and a second colonoscopy 10+ years ago was normal.  No known family history of colon polyps or colorectal cancer.  She has a history of PVCs without chest pain followed by cardiologist Dr. Crissie Sickles.  She underwent a 2D echo 05/21/2021 which showed LV EF 55 - 60%. Myoview stress test 05/21/2022 negative for CAD. Cardiac monitor 05/18/2021 demonstrated NSR with 38% PVC's bigeminy and  trigeminy without sustained VT. PVCs improved after starting Metoprolol.  Patient denies any chest pain, palpitations or shortness of breath at this time.      Latest Ref Rng & Units 11/07/2020   10:34 AM 07/18/2020   12:33 PM 09/01/2014    5:35 PM  CBC  WBC 3.4 - 10.8 x10E3/uL 10.0  8.9  11.7   Hemoglobin 11.1 - 15.9 g/dL 14.0  13.7  15.0   Hematocrit 34.0 - 46.6 % 44.3  42.5  45.4   Platelets 150 - 450 x10E3/uL 227  313  369        Latest Ref Rng & Units 05/02/2022    7:18 AM 02/06/2021   10:30 AM 11/07/2020   10:34 AM  CMP  Glucose 70 - 99 mg/dL 109  86  101   BUN 8 - 27 mg/dL '12  12  13   ' Creatinine 0.57 - 1.00 mg/dL 0.73  0.65  0.81   Sodium 134 - 144 mmol/L 142  145  141   Potassium 3.5 - 5.2 mmol/L 3.7  4.5  4.0   Chloride 96 - 106 mmol/L 104  104  102   CO2 20 - 29 mmol/L '24  23  23   ' Calcium 8.7 - 10.3 mg/dL 9.6  10.0  10.0   Total Protein 6.0 - 8.5 g/dL 7.1   7.6   Total Bilirubin 0.0 - 1.2 mg/dL 0.2   0.3   Alkaline Phos 44 - 121  IU/L 44   47   AST 0 - 40 IU/L 19   22   ALT 0 - 32 IU/L 18   23     ECHO 05/21/2022: 1. Frequent PVCs noted during the study. Left ventricular ejection fraction, by estimation, is 55 to 60%. The left ventricle has normal function. The left ventricle has no regional wall motion abnormalities. Left ventricular diastolic parameters were normal. 2. Right ventricular systolic function is normal. The right ventricular size is normal. Tricuspid regurgitation signal is inadequate for assessing PA pressure. 3. The mitral valve is grossly normal. Trivial mitral valve regurgitation. No evidence of mitral stenosis. 4. The aortic valve is tricuspid. Aortic valve regurgitation is not visualized. No aortic stenosis is present. 5. The inferior vena cava is normal in size with greater than 50% respiratory variability, suggesting right atrial pressure of 3 mmHg.  Past Medical History:  Diagnosis Date   Estrogen deficiency    Hypertension     Migraine    Past Surgical History:  Procedure Laterality Date   PARTIAL HYSTERECTOMY     Social History: She is single.  She has 1 son and 1 daughter.  Non-smoker.  No alcohol use.  No drug use.  Family History: Father with history of bladder and prostate cancer.  Mother with history of dementia.  Sister with history of breast cancer.  No known family history of esophageal, gastric or colon cancer.  Allergies  Allergen Reactions   Codeine Nausea And Vomiting      Outpatient Encounter Medications as of 07/30/2022  Medication Sig   amitriptyline (ELAVIL) 10 MG tablet Take 10 mg by mouth at bedtime.   amLODipine (NORVASC) 5 MG tablet Take 1 tablet (5 mg total) by mouth daily.   Blood Glucose Monitoring Suppl (TRUE METRIX METER) w/Device KIT Use as directed   lisinopril-hydrochlorothiazide (ZESTORETIC) 20-12.5 MG tablet Take 1 tablet by mouth daily.   Magnesium 200 MG TABS Take 1 tablet (200 mg total) by mouth at bedtime.   metFORMIN (GLUCOPHAGE) 500 MG tablet Take 1 tablet (500 mg total) by mouth 2 (two) times daily with a meal.   metoprolol succinate (TOPROL XL) 25 MG 24 hr tablet Take 1 tablet (25 mg total) by mouth 2 (two) times daily.   promethazine (PHENERGAN) 12.5 MG suppository Place 1 suppository (12.5 mg total) rectally every 6 (six) hours as needed for nausea or vomiting.   rosuvastatin (CRESTOR) 10 MG tablet Take 1 tablet (10 mg total) by mouth daily.   TRUEplus Lancets 28G MISC Use as directed   No facility-administered encounter medications on file as of 07/30/2022.     REVIEW OF SYSTEMS:  Gen: + Night sweats. No fever or chills. No weight loss.  CV: Denies chest pain, palpitations or edema. Resp: Denies cough, shortness of breath of hemoptysis.  GI: Denies heartburn, dysphagia, stomach or lower abdominal pain. No diarrhea or constipation.  GU : Denies urinary burning, blood in urine, increased urinary frequency or incontinence. MS:+ Arthritis.  Derm: Denies rash,  itchiness, skin lesions or unhealing ulcers. Psych: Denies depression, anxiety or memory loss. Heme: Denies bruising, easy bleeding. Neuro:  +  Migraine headaches.  Endo:  Denies any problems with DM, thyroid or adrenal function.  PHYSICAL EXAM: BP 132/76   Pulse 90   Ht '5\' 5"'  (1.651 m)   Wt 170 lb (77.1 kg)   BMI 28.29 kg/m  Wt Readings from Last 3 Encounters:  07/30/22 170 lb (77.1 kg)  07/04/22 172 lb 12.8 oz (  78.4 kg)  05/21/22 175 lb (79.4 kg)    General: 66 year-old female in no acute distress. Head: Normocephalic and atraumatic. Eyes:  Sclerae non-icteric, conjunctive pink. Ears: Normal auditory acuity. Mouth: Dentition intact. No ulcers or lesions.  Neck: Supple, no lymphadenopathy or thyromegaly.  Lungs: Clear bilaterally to auscultation without wheezes, crackles or rhonchi. Heart: Regular rate and rhythm. No murmur, rub or gallop appreciated.  Abdomen: Soft, nontender, non distended. No masses. No hepatosplenomegaly. Normoactive bowel sounds x 4 quadrants.  Rectal: Deferred. Musculoskeletal: Symmetrical with no gross deformities. Skin: Warm and dry. No rash or lesions on visible extremities. Extremities: No edema. Neurological: Alert oriented x 4, no focal deficits.  Psychological:  Alert and cooperative. Normal mood and affect.  ASSESSMENT AND PLAN:  81) 66 year old female with a history of nocturnal acid reflux for at least 2 to 3 years, symptoms improved after she started taking omeprazole daily 03/2023. -EGD to rule out GERD/Barrett's esophagus benefits and risks discussed including risk with sedation, risk of bleeding, perforation and infection  -GERD handout -Continue Omeprazole daily  2) Prior history of colon polyps.  Patient reported last colonoscopy 10+ years ago was normal, no polyps. -Request colonoscopy records from [facility] -Colonoscopy benefits and risks discussed including risk with sedation, risk of bleeding, perforation and infection  -SEE  ADDENDUM BELOW  3) Episodic diarrhea, nonbloody -Avoid dairy products or take Lactaid 1-2 tabs with each dairy product -Await colonoscopy results -Patient to contact office if she has persistent diarrhea  Further recommendations to be determined after the above evaluation completed  ADDENDUM: [facility] records received. Patient  underwent a colonoscopy by Dr. Therisa Doyne on 07/14/2018 which showed diverticulosis, no polyps. Recall colonoscopy recommended in 10 years.  Dr. Loletha Carrow was updated regarding 07/14/2018 colonoscopy results. Patient to proceed with EGD as scheduled 08/13/2022, colonoscopy cancelled. Next screening colonoscopy due 06/2028. Patient was notified. See phone note.          CC:  Wenda Low, MD

## 2022-07-30 NOTE — Patient Instructions (Signed)
You have been scheduled for an endoscopy and colonoscopy. Please follow the written instructions given to you at your visit today. Please pick up your prep supplies at the pharmacy within the next 1-3 days. If you use inhalers (even only as needed), please bring them with you on the day of your procedure.   Take Lactaid over the counter.  Due to recent changes in healthcare laws, you may see the results of your imaging and laboratory studies on MyChart before your provider has had a chance to review them.  We understand that in some cases there may be results that are confusing or concerning to you. Not all laboratory results come back in the same time frame and the provider may be waiting for multiple results in order to interpret others.  Please give [place] 48 hours in order for your provider to thoroughly review all the results before contacting the office for clarification of your results.    It was a pleasure to see you today!  Thank you for trusting me with your gastrointestinal care!

## 2022-08-08 ENCOUNTER — Telehealth: Payer: Self-pay | Admitting: Nurse Practitioner

## 2022-08-08 NOTE — Telephone Encounter (Signed)
Terry [name], I received the requested [facility] records. Patient underwent a colonoscopy with Dr. Therisa Doyne 07/14/2018 which showed sigmoid diverticulosis, no polyps and the prep was good. (At the time of her office visit 07/30/2022, she though her last colonoscopy was10 years ago). I communicated with Dr. Loletha Carrow and he verified she does not need a colonoscopy at this time. She is currently scheduled for an EGD and colonoscopy with Dr. Loletha Carrow on 08/13/2020. Please cancel the colonoscopy, patient to proceed with the EGD as scheduled. I called the patient and I reviewed all of this with her. Please send her the EGD prep instructions. Thx.

## 2022-08-09 NOTE — Telephone Encounter (Signed)
Colonoscopy canceled: Pt made aware Prep instructions for EGD  sent to pt via my chart: Pt made aware: Pt verbalized understanding with all questions answered.

## 2022-08-13 ENCOUNTER — Ambulatory Visit (AMBULATORY_SURGERY_CENTER): Payer: Medicare Other | Admitting: Gastroenterology

## 2022-08-13 ENCOUNTER — Encounter: Payer: Self-pay | Admitting: Gastroenterology

## 2022-08-13 VITALS — BP 140/63 | HR 47 | Temp 98.4°F | Resp 11 | Ht 65.0 in | Wt 170.0 lb

## 2022-08-13 DIAGNOSIS — K219 Gastro-esophageal reflux disease without esophagitis: Secondary | ICD-10-CM | POA: Diagnosis not present

## 2022-08-13 MED ORDER — SODIUM CHLORIDE 0.9 % IV SOLN: 500.0000 mL | INTRAVENOUS | Status: DC

## 2022-08-13 NOTE — Progress Notes (Signed)
To pacu, VSS. Report to [email or web address] 

## 2022-08-13 NOTE — Patient Instructions (Addendum)
Follow an antireflux regimen indefinitely  With intermittent symptoms and no esophagitis or Barrett's seen on this exam, focus on diet/lifestyle anti-reflux measures and continue as needed use of acid suppression medicine.  Consider other causes of recumbent cough as well (ie allergic, post-nasal drip)  YOU HAD AN ENDOSCOPIC PROCEDURE TODAY AT [facility]:   Refer to the procedure report that was given to you for any specific questions about what was found during the examination.  If the procedure report does not answer your questions, please call your gastroenterologist to clarify.  If you requested that your care partner not be given the details of your procedure findings, then the procedure report has been included in a sealed envelope for you to review at your convenience later.  YOU SHOULD EXPECT: Some feelings of bloating in the abdomen. Passage of more gas than usual.  Walking can help get rid of the air that was put into your GI tract during the procedure and reduce the bloating. If you had a lower endoscopy (such as a colonoscopy or flexible sigmoidoscopy) you may notice spotting of blood in your stool or on the toilet paper. If you underwent a bowel prep for your procedure, you may not have a normal bowel movement for a few days.  Please Note:  You might notice some irritation and congestion in your nose or some drainage.  This is from the oxygen used during your procedure.  There is no need for concern and it should clear up in a day or so.  SYMPTOMS TO REPORT IMMEDIATELY:  Following upper endoscopy (EGD)  Vomiting of blood or coffee ground material  New chest pain or pain under the shoulder blades  Painful or persistently difficult swallowing  New shortness of breath  Fever of 100F or higher  Black, tarry-looking stools  For urgent or emergent issues, a gastroenterologist can be reached at any hour by calling [phone]. Do not use MyChart messaging for urgent  concerns.    DIET:  We do recommend a small meal at first, but then you may proceed to your regular diet.  Drink plenty of fluids but you should avoid alcoholic beverages for 24 hours.  ACTIVITY:  You should plan to take it easy for the rest of today and you should NOT DRIVE or use heavy machinery until tomorrow (because of the sedation medicines used during the test).    FOLLOW UP: Our staff will call the number listed on your records the next business day following your procedure.  We will call around 7:15- 8:00 am to check on you and address any questions or concerns that you may have regarding the information given to you following your procedure. If we do not reach you, we will leave a message.     If any biopsies were taken you will be contacted by phone or by letter within the next 1-3 weeks.  Please call us at [phone] if you have not heard about the biopsies in 3 weeks.    SIGNATURES/CONFIDENTIALITY: You and/or your care partner have signed paperwork which will be entered into your electronic medical record.  These signatures attest to the fact that that the information above on your After Visit Summary has been reviewed and is understood.  Full responsibility of the confidentiality of this discharge information lies with you and/or your care-partner. _________________________________    _______________________________________________________  Food Guidelines for those with chronic digestive trouble:  Many people have difficulty digesting certain foods, causing a  variety of distressing and embarrassing symptoms such as abdominal pain, bloating and gas.  These foods may need to be avoided or consumed in small amounts.  Here are some tips that might be helpful for you.  1.   Lactose intolerance is the difficulty or complete inability to digest lactose, the natural sugar in milk and anything made from milk.  This condition is harmless, common, and can begin any time during life.   Some people can digest a modest amount of lactose while others cannot tolerate any.  Also, not all dairy products contain equal amounts of lactose.  For example, hard cheeses such as parmesan have less lactose than soft cheeses such as cheddar.  Yogurt has less lactose than milk or cheese.  Many packaged foods (even many brands of bread) have milk, so read ingredient lists carefully.  It is difficult to test for lactose intolerance, so just try avoiding lactose as much as possible for a week and see what happens with your symptoms.  If you seem to be lactose intolerant, the best plan is to avoid it (but make sure you get calcium from another source).  The next best thing is to use lactase enzyme supplements, available over the counter everywhere.  Just know that many lactose intolerant people need to take several tablets with each serving of dairy to avoid symptoms.  Lastly, a lot of restaurant food is made with milk or butter.  Many are things you might not suspect, such as mashed potatoes, rice and pasta (cooked with butter) and "grilled" items.  If you are lactose intolerant, it never hurts to ask your server what has milk or butter.  2.   Fiber is an important part of your diet, but not all fiber is well-tolerated.  Insoluble fiber such as bran is often consumed by normal gut bacteria and converted into gas.  Soluble fiber such as oats, squash, carrots and green beans are typically tolerated better.  3.   Some types of carbohydrates can be poorly digested.  Examples include: fructose (apples, cherries, pears, raisins and other dried fruits), fructans (onions, zucchini, large amounts of wheat), sorbitol/mannitol/xylitol and sucralose/Splenda (common artificial sweeteners), and raffinose (lentils, broccoli, cabbage, asparagus, brussel sprouts, many types of beans).  Do a Programmer, multimedia for [employer] [place] and you will find helpful information. Beano, a dietary supplement, will often help with raffinose-containing  foods.  As with lactase tablets, you may need several per serving.  4.   Whenever possible, avoid processed food&meats and chemical additives.  High fructose corn syrup, a common sweetener, may be difficult to digest.  Eggs and soy (comes from the soybean, and added to many foods now) are other common bloating/gassy foods.  5.  Regarding gluten:  gluten is a protein mainly found in wheat, but also rye and barley.  There is a condition called celiac sprue, which is an inflammatory reaction in the small intestine causing a variety of digestive symptoms.  Blood testing is highly reliable to look for this condition, and sometimes upper endoscopy with small bowel biopsies may be necessary to make the diagnosis.  Many patients who test negative for celiac sprue report improvement in their digestive symptoms when they switch to a gluten-free diet.  However, in these "non-celiac gluten sensitive" patients, the true role of gluten in their symptoms is unclear.  Reducing carbohydrates in general may decrease the gas and bloating caused when gut bacteria consume carbs. Also, some of these patients may actually be intolerant of the  baker's yeast in bread products rather than the gluten.  Flatbread and other reduced yeast breads might therefore be tolerated.  There is no specific testing available for most food intolerances, which are discovered mainly by dietary elimination.  Please do not embark on a gluten free diet unless directed by your doctor, as it is highly restrictive, and may lead to nutritional deficiencies if not carefully monitored.  Lastly, beware of internet claims offering "personalized" tests for food intolerances.  Such testing has no reliable scientific evidence to support its reliability and correlation to symptoms.    6.  The best advice is old advice, especially for those with chronic digestive trouble - try to eat "clean".  Balanced diet, avoid processed food, plenty of fruits and vegetables, cut  down the sugar, minimal alcohol, avoid tobacco. Make time to care for yourself, get enough sleep, exercise when you can, reduce stress.  Your guts will thank you for it.   - Dr. Sherlynn [facility]  ____________________________________________________________

## 2022-08-13 NOTE — Progress Notes (Signed)
Pt's states no medical or surgical changes since previsit or office visit. 

## 2022-08-13 NOTE — Op Note (Signed)
[facility] Patient Name: Terry [name] Procedure Date: 08/13/2022 2:41 PM MRN: [id number] Endoscopist: Pevely. Loletha Carrow , MD, [id number] Age: 66 Referring MD:  Date of Birth: July 08, 1956 Gender: Female Account #: [id number] Procedure:                Upper GI endoscopy Indications:              Esophageal reflux symptoms that recur despite                            appropriate therapy                           episodic regurgitation/indigestion and nocturnal                            cough Medicines:                Monitored Anesthesia Care Procedure:                Pre-Anesthesia Assessment:                           - Prior to the procedure, a History and Physical                            was performed, and patient medications and                            allergies were reviewed. The patient's tolerance of                            previous anesthesia was also reviewed. The risks                            and benefits of the procedure and the sedation                            options and risks were discussed with the patient.                            All questions were answered, and informed consent                            was obtained. Prior Anticoagulants: The patient has                            taken no anticoagulant or antiplatelet agents. ASA                            Grade Assessment: II - A patient with mild systemic                            disease. After reviewing the risks and benefits,  the patient was deemed in satisfactory condition to                            undergo the procedure.                           After obtaining informed consent, the endoscope was                            passed under direct vision. Throughout the                            procedure, the patient's blood pressure, pulse, and                            oxygen saturations were monitored continuously. The                             Endoscope was introduced through the mouth, and                            advanced to the second part of duodenum. The upper                            GI endoscopy was accomplished without difficulty.                            The patient tolerated the procedure well. Scope In: Scope Out: Findings:                 There is no endoscopic evidence of Barrett's                            esophagus, esophagitis or stricture in the entire                            esophagus.                           The stomach was normal.                           The cardia and gastric fundus were normal on                            retroflexion. (Hill grade 3)                           The examined duodenum was normal. Complications:            No immediate complications. Estimated Blood Loss:     Estimated blood loss: none. Impression:               - Normal stomach.                           - Normal examined duodenum.                           -  No specimens collected. Recommendation:           - Patient has a contact number available for                            emergencies. The signs and symptoms of potential                            delayed complications were discussed with the                            patient. Return to normal activities tomorrow.                            Written discharge instructions were provided to the                            patient.                           - Resume previous diet.                           - Continue present medications.                           - Follow an antireflux regimen indefinitely.                           With intermittent symptoms and no esophagitis or                            Barrett's seen on this exam, focus on                            diet/lifestyle anti-reflux measures and continue                            as-needed use of acid suppression medicine.                            Consider other causes of recumbent cough  as well                            (ie allergic, post-nasal drip) Brexley Cutshaw L. Myrtie Neither, MD 08/13/2022 3:23:17 PM This report has been signed electronically.

## 2022-08-13 NOTE — Progress Notes (Signed)
No changes to clinical history since GI office visit on 07/30/22.  The patient is appropriate for an endoscopic procedure in the ambulatory setting.  - Ryosuke Ericksen Danis, MD    

## 2022-08-14 ENCOUNTER — Telehealth: Payer: Self-pay

## 2022-08-14 NOTE — Telephone Encounter (Signed)
No answer, left message to call if having any issues or concerns, B.Mirtha Jain RN 

## 2022-10-22 ENCOUNTER — Ambulatory Visit
Admission: RE | Admit: 2022-10-22 | Discharge: 2022-10-22 | Disposition: A | Discharge status: Home / Self Care | Payer: Medicare Other | Source: Ambulatory Visit | Attending: Internal Medicine | Admitting: Internal Medicine

## 2022-10-22 DIAGNOSIS — Z1231 Encounter for screening mammogram for malignant neoplasm of breast: Secondary | ICD-10-CM | POA: Diagnosis not present

## 2022-10-22 DIAGNOSIS — E2839 Other primary ovarian failure: Secondary | ICD-10-CM

## 2022-10-22 DIAGNOSIS — Z780 Asymptomatic menopausal state: Secondary | ICD-10-CM | POA: Diagnosis not present

## 2022-10-29 ENCOUNTER — Other Ambulatory Visit: Payer: Self-pay

## 2022-10-29 MED ORDER — ROSUVASTATIN CALCIUM 10 MG PO TABS: 10.0000 mg | ORAL_TABLET | Freq: Every day | ORAL | 1 refills | Status: DC

## 2022-11-05 DIAGNOSIS — I10 Essential (primary) hypertension: Secondary | ICD-10-CM | POA: Diagnosis not present

## 2022-11-05 DIAGNOSIS — E1169 Type 2 diabetes mellitus with other specified complication: Secondary | ICD-10-CM | POA: Diagnosis not present

## 2022-11-05 DIAGNOSIS — K219 Gastro-esophageal reflux disease without esophagitis: Secondary | ICD-10-CM | POA: Diagnosis not present

## 2022-11-05 DIAGNOSIS — E119 Type 2 diabetes mellitus without complications: Secondary | ICD-10-CM | POA: Diagnosis not present

## 2022-11-05 DIAGNOSIS — M1990 Unspecified osteoarthritis, unspecified site: Secondary | ICD-10-CM | POA: Diagnosis not present

## 2022-12-06 NOTE — Progress Notes (Signed)
Cardiology Clinic Note   Patient Name: [name] Date of Encounter: 12/10/2022  Primary Care Provider:  Wenda Low, MD Primary Cardiologist:  Freada Bergeron, MD  Patient Profile    Terry [name] is a 67 y.o. female with a past medical history of palpitations, hypertension, T2DM who presents to the clinic today for 56-monthfollow-up.  Past Medical History    Past Medical History:  Diagnosis Date   Arthritis    Colon polyps    Diabetes ([place]    Estrogen deficiency    Hypertension    Migraine    Past Surgical History:  Procedure Laterality Date   PARTIAL HYSTERECTOMY      Allergies  Allergies  Allergen Reactions   Codeine Nausea And Vomiting    History of Present Illness    Terry FRANCESCONIhas a past medical history of: Palpitations. 2-day ZIO 05/18/2022: Predominant rhythm was NSR with average rate 89 bpm.  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  No sustained arrhythmias or significant pauses. Nuclear stress test 05/21/2022: Frequent PVCs at rest, stress, and recovery.  Negative stress test.  Intermediate risk in the setting of LV function (correlates with concomitant echo with normal LV function) and PVCs. Echo 05/21/2022: Frequent PVCs noted.  EF 55 to 60%.  Trivial MR. Hypertension. Hyperlipidemia. Lipid panel 05/02/2022: LDL 132, HDL 57, TG 84, total 204. T2DM.  Ms. ACastellaniwas first seen by Dr. PJohney Frameon 04/30/2022 for palpitations at the request of Dr. HLysle Rubens PCP.  Patient was seen by her PCP on a routine visit in June 2023 and noted to be in ventricular bigeminy and referred to cardiology.  Patient reported a years long history of palpitations but noted decreased exercise tolerance including getting winded during shoulder rehab and needing to pause while walking up a flight of stairs.  Echo and nuclear stress test showed frequent PVCs but were overall normal.  ZIO showed no sustained arrhythmias or significant pauses but very frequent PVCs 38.4% (details  above).  Patient was started on metoprolol and referred to EP.  Patient was evaluated by Dr. TLovena Leon 07/04/2022.  At that time she reported improvement with PVCs while taking metoprolol.  Plan to repeat ZIO at the beginning of the new year.  Today, patient reports she is doing well. She states she feels like her heart is beating fast on occasion but it does not really bother her. She received the Zio monitor in the mail but has not worn it yet. She is slightly hypertensive today with BP 143/82 on intake and 140/80 on recheck. She denies vision changes, headaches or dizziness. She did not take her medications this morning. Patient denies shortness of breath or dyspnea on exertion. No chest pain, pressure, or tightness. Denies orthopnea or PND. She reports lower extremity edema occasionally at the end of the day. She wakes up with her legs normal and by the end of the day they are a little bit puffy. She sits with them in a dependent position much of the day. She does not do formal exercise.    Home Medications    Current Meds  Medication Sig   amitriptyline (ELAVIL) 10 MG tablet Take 10 mg by mouth at bedtime.   amLODipine (NORVASC) 5 MG tablet Take 1 tablet (5 mg total) by mouth daily.   Blood Glucose Monitoring Suppl (TRUE METRIX METER) w/Device KIT Use as directed   lisinopril-hydrochlorothiazide (ZESTORETIC) 20-12.5 MG tablet Take 1 tablet by mouth daily.   Magnesium 200 MG  TABS Take 1 tablet (200 mg total) by mouth at bedtime.   metFORMIN (GLUCOPHAGE) 500 MG tablet Take 1 tablet (500 mg total) by mouth 2 (two) times daily with a meal.   metoprolol succinate (TOPROL XL) 25 MG 24 hr tablet Take 1 tablet (25 mg total) by mouth 2 (two) times daily.   promethazine (PHENERGAN) 12.5 MG suppository Place 1 suppository (12.5 mg total) rectally every 6 (six) hours as needed for nausea or vomiting.   rosuvastatin (CRESTOR) 10 MG tablet Take 1 tablet (10 mg total) by mouth daily.   TRUEplus Lancets 28G  MISC Use as directed    Family History    Family History  Problem Relation Age of Onset   Dementia Mother    Bladder Cancer Father    Breast cancer Sister    Colon cancer Neg Hx    Stomach cancer Neg Hx    Esophageal cancer Neg Hx    She indicated that her mother is alive. She indicated that her father is deceased. She indicated that the status of her sister is unknown. She indicated that the status of her neg hx is unknown.   Social History    Social History   Socioeconomic History   Marital status: Single    Spouse name: Not on file   Number of children: 2   Years of education: 12th   Highest education level: Not on file  Occupational History    Employer: [employer]   Occupation: retired  Tobacco Use   Smoking status: Never   Smokeless tobacco: Never  Vaping Use   Vaping Use: Never used  Substance and Sexual Activity   Alcohol use: Not Currently    Comment: socially   Drug use: No   Sexual activity: Not on file  Other Topics Concern   Not on file  Social History Narrative   Pt lives at home with family.   Caffeine Use: 1-2 cups occasionally   Social Determinants of Health   Financial Resource Strain: Not on file  Food Insecurity: Not on file  Transportation Needs: Not on file  Physical Activity: Not on file  Stress: Not on file  Social Connections: Not on file  Intimate Partner Violence: Not on file     Review of Systems    General:  No chills, fever, night sweats or weight changes.  Cardiovascular:  No chest pain, dyspnea on exertion, edema, orthopnea, palpitations, paroxysmal nocturnal dyspnea. Dermatological: No rash, lesions/masses Respiratory: No cough, dyspnea Urologic: No hematuria, dysuria Abdominal:   No nausea, vomiting, diarrhea, bright red blood per rectum, melena, or hematemesis Neurologic:  No visual changes, weakness, changes in mental status. All other systems reviewed and are otherwise negative except as noted  above.  Physical Exam    VS:  BP (!) 143/82   Pulse 88   Ht '5\' 5"'$  (1.651 m)   Wt 178 lb 9.6 oz (81 kg)   SpO2 98%   BMI 29.72 kg/m  , BMI Body mass index is 29.72 kg/m. GEN:  Well nourished, well developed, in no acute distress. HEENT: Normal. Neck: Supple, no JVD, carotid bruits, or masses. Cardiac: RRR, no murmurs, rubs, or gallops. No clubbing, cyanosis, edema.  Radials/DP/PT 2+ and equal bilaterally.  Respiratory:  Respirations regular and unlabored, clear to auscultation bilaterally. GI: Soft, nontender, nondistended. MS: No deformity or atrophy. Skin: Warm and dry, no rash. Neuro: Strength and sensation are intact. Psych: Normal affect.  Accessory Clinical Findings     Recent  Labs: 05/02/2022: ALT 18; BUN 12; Creatinine, Ser 0.73; Magnesium 1.7; Potassium 3.7; Sodium 142; TSH 2.010   Recent Lipid Panel    Component Value Date/Time   CHOL 204 (H) 05/02/2022 0718   TRIG 84 05/02/2022 0718   HDL 57 05/02/2022 0718   CHOLHDL 3.6 05/02/2022 0718   LDLCALC 132 (H) 05/02/2022 0718    HYPERTENSION CONTROL Vitals:   12/10/22 1344 12/10/22 1525  BP: (!) 143/82 (!) 140/80    The patient's blood pressure is elevated above target today.  In order to address the patient's elevated BP: Blood pressure will be monitored at home to determine if medication changes need to be made. (Did not take medications today.)      ECG is not indicated today.    Assessment & Plan   Palpitations/frequent PVCs.  ZIO August 2023 showed 38% PVC burden.  Patient reports feeling her heart beating fast on occasion but denies palpitations. She denies shortness of breath, orthopnea or PND. She received a Zio monitor in the mail but has not worn it yet. She plans to do so this weekend. No ectopy on auscultation. Continue metoprolol. Hypertension.  BP today 143/82 at intake and 140/80 on my recheck. Patient denies vision changes, headaches or dizziness. She did not take her medications this  morning. Continue amlodipine, lisinopril/HCTZ, and metoprolol. Hyperlipidemia.  LDL July 2023 132, not at goal.  Continue rosuvastatin. She is not fasting today. She has an appointment coming up with her PCP. She believes he normally checks all her labs.      Disposition: Return to Dr. Johney Frame in 6 months or sooner as needed.    Justice [place]. Morse Brueggemann, DNP, NP-C     12/10/2022, 1:48 PM [facility] [address] Office [phone] Fax [phone]

## 2022-12-10 ENCOUNTER — Encounter: Payer: Self-pay | Admitting: Student

## 2022-12-10 ENCOUNTER — Ambulatory Visit: Payer: Medicare Other | Attending: Physician Assistant | Admitting: Student

## 2022-12-10 VITALS — BP 140/80 | HR 88 | Ht 65.0 in | Wt 178.6 lb

## 2022-12-10 DIAGNOSIS — I493 Ventricular premature depolarization: Secondary | ICD-10-CM

## 2022-12-10 DIAGNOSIS — I10 Essential (primary) hypertension: Secondary | ICD-10-CM

## 2022-12-10 NOTE — Patient Instructions (Addendum)
Medication Instructions:  Your physician recommends that you continue on your current medications as directed. Please refer to the Current Medication list given to you today.  *If you need a refill on your cardiac medications before your next appointment, please call your pharmacy*   Lab Work: None ordered  If you have labs (blood work) drawn today and your tests are completely normal, you will receive your results only by: [place] (if you have MyChart) OR A paper copy in the mail If you have any lab test that is abnormal or we need to change your treatment, we will call you to review the results.   Testing/Procedures: None ordered  Follow-Up: At [facility], you and your health needs are our priority.  As part of our continuing mission to provide you with exceptional heart care, we have created designated Provider Care Teams.  These Care Teams include your primary Cardiologist (physician) and Advanced Practice Providers (APPs -  Physician Assistants and Nurse Practitioners) who all work together to provide you with the care you need, when you need it.  We recommend signing up for the patient portal called "MyChart".  Sign up information is provided on this After Visit Summary.  MyChart is used to connect with patients for Virtual Visits (Telemedicine).  Patients are able to view lab/test results, encounter notes, upcoming appointments, etc.  Non-urgent messages can be sent to your provider as well.   To learn more about what you can do with MyChart, go to [email or web address].    Your next appointment:   6 month(s)  Provider:   None     Other Instructions

## 2022-12-15 DIAGNOSIS — I10 Essential (primary) hypertension: Secondary | ICD-10-CM | POA: Diagnosis not present

## 2022-12-15 DIAGNOSIS — I493 Ventricular premature depolarization: Secondary | ICD-10-CM

## 2022-12-24 DIAGNOSIS — I10 Essential (primary) hypertension: Secondary | ICD-10-CM | POA: Diagnosis not present

## 2022-12-24 DIAGNOSIS — I493 Ventricular premature depolarization: Secondary | ICD-10-CM | POA: Diagnosis not present

## 2022-12-31 ENCOUNTER — Ambulatory Visit: Payer: Medicare Other | Admitting: Nurse Practitioner

## 2022-12-31 ENCOUNTER — Telehealth: Payer: Self-pay

## 2022-12-31 NOTE — Telephone Encounter (Signed)
-----   Message from Evans Lance, MD sent at 12/29/2022 11:14 PM EDT ----- No significant arrhythmias.

## 2023-02-12 NOTE — Progress Notes (Signed)
[facility] - Clinic Note  02/18/2023     CHIEF COMPLAINT Patient presents for Retina Evaluation   HISTORY OF PRESENT ILLNESS: Terry [name] is a 67 y.o. female who presents to the clinic today for:   HPI     Retina Evaluation   In both eyes.  This started 3 years ago.  Associated Symptoms Floaters.  Negative for Flashes.  Context:  distance vision and near vision.  I, the attending physician,  performed the HPI with the patient and updated documentation appropriately.        Comments   Patient is here today based on a referral from Dr. Eula Listen for diabetes. She does not check her blood sugar daily. She is not using any eye drops at this time.       Last edited by Rennis Chris, MD on 02/20/2023  5:22 AM.    Pt is here on the referral of her PCP, Dr. Donette Larry, for DM exam, pts A1c was 6.4 in January, pt is on metformin, she does not have a regular eye dr and has never had her eyes checked for diabetes, pt denies distortion OS  Referring physician: Georgann Housekeeper, MD [address] [place],  [place] [address]  HISTORICAL INFORMATION:   Selected notes from the MEDICAL RECORD NUMBER Referred by Dr. Donette Larry for DM exam LEE:  Ocular Hx- PMH-    CURRENT MEDICATIONS: No current outpatient medications on file. (Ophthalmic Drugs)   No current facility-administered medications for this visit. (Ophthalmic Drugs)   Current Outpatient Medications (Other)  Medication Sig   amitriptyline (ELAVIL) 10 MG tablet Take 10 mg by mouth at bedtime.   Blood Glucose Monitoring Suppl (TRUE METRIX METER) w/Device KIT Use as directed   Magnesium 200 MG TABS Take 1 tablet (200 mg total) by mouth at bedtime.   metoprolol succinate (TOPROL XL) 25 MG 24 hr tablet Take 1 tablet (25 mg total) by mouth 2 (two) times daily.   promethazine (PHENERGAN) 12.5 MG suppository Place 1 suppository (12.5 mg total) rectally every 6 (six) hours as needed for nausea or vomiting.    rosuvastatin (CRESTOR) 10 MG tablet Take 1 tablet (10 mg total) by mouth daily.   TRUEplus Lancets 28G MISC Use as directed   amLODipine (NORVASC) 5 MG tablet Take 1 tablet (5 mg total) by mouth daily.   lisinopril-hydrochlorothiazide (ZESTORETIC) 20-12.5 MG tablet Take 1 tablet by mouth daily.   metFORMIN (GLUCOPHAGE) 500 MG tablet Take 1 tablet (500 mg total) by mouth 2 (two) times daily with a meal.   No current facility-administered medications for this visit. (Other)   REVIEW OF SYSTEMS: ROS   Positive for: Gastrointestinal, Endocrine, Eyes Last edited by Julieanne Cotton, COT on 02/18/2023  8:08 AM.     ALLERGIES Allergies  Allergen Reactions   Codeine Nausea And Vomiting   PAST MEDICAL HISTORY Past Medical History:  Diagnosis Date   Arthritis    Colon polyps    Diabetes (HCC)    Estrogen deficiency    Hypertension    Migraine    Past Surgical History:  Procedure Laterality Date   PARTIAL HYSTERECTOMY     FAMILY HISTORY Family History  Problem Relation Age of Onset   Dementia Mother    Bladder Cancer Father    Breast cancer Sister    Colon cancer Neg Hx    Stomach cancer Neg Hx    Esophageal cancer Neg Hx    SOCIAL HISTORY Social  History   Tobacco Use   Smoking status: Never   Smokeless tobacco: Never  Vaping Use   Vaping Use: Never used  Substance Use Topics   Alcohol use: Not Currently    Comment: socially   Drug use: No       OPHTHALMIC EXAM:  Base Eye Exam     Visual Acuity (Snellen - Linear)       Right Left   Dist [place] 20/20 -2 20/20 +2         Tonometry (Tonopen, 8:11 AM)       Right Left   Pressure 18 19         Pupils       Dark Light Shape React APD   Right 3 2 Round Brisk None   Left 3 2 Round Brisk None         Visual Fields       Left Right    Full Full         Extraocular Movement       Right Left    Full, Ortho Full, Ortho         Neuro/Psych     Oriented x3: Yes         Dilation      Both eyes: 1.0% Mydriacyl, 2.5% Phenylephrine @ 8:09 AM           Slit Lamp and Fundus Exam     Slit Lamp Exam       Right Left   Lids/Lashes Dermatochalasis - upper lid Dermatochalasis - upper lid   Conjunctiva/Sclera White and quiet White and quiet   Cornea mild arcus mild arcus   Anterior Chamber deep and clear deep and clear   Iris Round and dilated, No NVI Round and dilated, No NVI   Lens 2-3+ Nuclear sclerosis, 2-3+ Cortical cataract 2-3+ Nuclear sclerosis, 2-3+ Cortical cataract   Anterior Vitreous mild syneresis mild syneresis         Fundus Exam       Right Left   Disc Pink and Sharp Pink and Sharp, mild PPA   C/D Ratio 0.3 0.2   Macula Flat, Good foveal reflex, No heme or edema Flat, Good foveal reflex, ERM with early striae, No heme or edema   Vessels mild attenuation mild attenuation   Periphery Attached, No heme Attached, No heme            IMAGING AND PROCEDURES  Imaging and Procedures for 02/18/2023  OCT, Retina - OU - Both Eyes       Right Eye Quality was good. Central Foveal Thickness: 271. Progression has no prior data. Findings include normal foveal contour, no IRF, no SRF (Partial PVD).   Left Eye Quality was good. Central Foveal Thickness: 363. Progression has no prior data. Findings include no IRF, no SRF, abnormal foveal contour, epiretinal membrane, macular pucker (ERM with very mild, early pucker, Partial PVD).   Notes *Images captured and stored on drive  Diagnosis / Impression:  No DME OU OD: NFP, no IRF/SRF OS: ERM with very mild, early pucker, Partial PVD  Clinical management:  See below  Abbreviations: NFP - Normal foveal profile. CME - cystoid macular edema. PED - pigment epithelial detachment. IRF - intraretinal fluid. SRF - subretinal fluid. EZ - ellipsoid zone. ERM - epiretinal membrane. ORA - outer retinal atrophy. ORT - outer retinal tubulation. SRHM - subretinal hyper-reflective material. IRHM - intraretinal  hyper-reflective material  ASSESSMENT/PLAN:    ICD-10-CM   1. Diabetes mellitus type 2 without retinopathy (HCC)  E11.9 OCT, Retina - OU - Both Eyes    2. Long term (current) use of oral hypoglycemic drugs  Z79.84     3. Essential hypertension  I10     4. Hypertensive retinopathy of both eyes  H35.033     5. Epiretinal membrane (ERM) of left eye  H35.372     6. Combined forms of age-related cataract of both eyes  H25.813       1,2. Diabetes mellitus, type 2 without retinopathy  - A1c: 6.4 on 01.22.24 - The incidence, risk factors for progression, natural history and treatment options for diabetic retinopathy  were discussed with patient.   - The need for close monitoring of blood glucose, blood pressure, and serum lipids, avoiding cigarette or any type of tobacco, and the need for long term follow up was also discussed with patient. - f/u in 1 year, sooner prn  3,4. Hypertensive retinopathy OU - discussed importance of tight BP control - monitor  5. Epiretinal membrane, left eye  - The natural history, anatomy, potential for loss of vision, and treatment options including vitrectomy techniques and the complications of endophthalmitis, retinal detachment, vitreous hemorrhage, cataract progression and permanent vision loss discussed with the patient. - mild ERM - BCVA 20/20 - asymptomatic, no metamorphopsia - no indication for surgery at this time - monitor for now - f/u 6 mos -- DFE/OCT  6. Mixed Cataract OU - The symptoms of cataract, surgical options, and treatments and risks were discussed with patient. - discussed diagnosis and progression - not yet visually significant - monitor for now  Ophthalmic Meds Ordered this visit:  No orders of the defined types were placed in this encounter.    Return in about 6 months (around 08/21/2023) for f/u ERM OS, DFE, OCT.  There are no Patient Instructions on file for this visit.   Explained the diagnoses,  plan, and follow up with the patient and they expressed understanding.  Patient expressed understanding of the importance of proper follow up care.   This document serves as a record of services personally performed by Karie Chimera, MD, PhD. It was created on their behalf by Annalee Genta, COMT. The creation of this record is the provider's dictation and/or activities during the visit.  Electronically signed by: Annalee Genta, COMT 02/20/23 5:22 AM  Karie Chimera, M.D., Ph.D. Diseases & Surgery of the Retina and Vitreous [facility]  I have reviewed the above documentation for accuracy and completeness, and I agree with the above. Karie Chimera, M.D., Ph.D. 02/20/23 5:24 AM  Abbreviations: M myopia (nearsighted); A astigmatism; H hyperopia (farsighted); P presbyopia; Mrx spectacle prescription;  CTL contact lenses; OD right eye; OS left eye; OU both eyes  XT exotropia; ET esotropia; PEK punctate epithelial keratitis; PEE punctate epithelial erosions; DES dry eye syndrome; MGD meibomian gland dysfunction; ATs artificial tears; PFAT's preservative free artificial tears; NSC nuclear sclerotic cataract; PSC posterior subcapsular cataract; ERM epi-retinal membrane; PVD posterior vitreous detachment; RD retinal detachment; DM diabetes mellitus; DR diabetic retinopathy; NPDR non-proliferative diabetic retinopathy; PDR proliferative diabetic retinopathy; CSME clinically significant macular edema; DME diabetic macular edema; dbh dot blot hemorrhages; CWS cotton wool spot; POAG primary open angle glaucoma; C/D cup-to-disc ratio; HVF humphrey visual field; GVF goldmann visual field; OCT optical coherence tomography; IOP intraocular pressure; BRVO Branch retinal vein occlusion; CRVO central retinal vein occlusion; CRAO central retinal  artery occlusion; BRAO branch retinal artery occlusion; RT retinal tear; SB scleral buckle; PPV pars plana vitrectomy; VH Vitreous hemorrhage; PRP  panretinal laser photocoagulation; IVK intravitreal kenalog; VMT vitreomacular traction; MH Macular hole;  NVD neovascularization of the disc; NVE neovascularization elsewhere; AREDS age related eye disease study; ARMD age related macular degeneration; POAG primary open angle glaucoma; EBMD epithelial/anterior basement membrane dystrophy; ACIOL anterior chamber intraocular lens; IOL intraocular lens; PCIOL posterior chamber intraocular lens; Phaco/IOL phacoemulsification with intraocular lens placement; PRK photorefractive keratectomy; LASIK laser assisted in situ keratomileusis; HTN hypertension; DM diabetes mellitus; COPD chronic obstructive pulmonary disease

## 2023-02-18 ENCOUNTER — Ambulatory Visit (INDEPENDENT_AMBULATORY_CARE_PROVIDER_SITE_OTHER): Payer: Medicare Other | Admitting: Ophthalmology

## 2023-02-18 ENCOUNTER — Encounter (INDEPENDENT_AMBULATORY_CARE_PROVIDER_SITE_OTHER): Payer: Self-pay | Admitting: Ophthalmology

## 2023-02-18 DIAGNOSIS — H3581 Retinal edema: Secondary | ICD-10-CM

## 2023-02-18 DIAGNOSIS — H35033 Hypertensive retinopathy, bilateral: Secondary | ICD-10-CM | POA: Diagnosis not present

## 2023-02-18 DIAGNOSIS — H25813 Combined forms of age-related cataract, bilateral: Secondary | ICD-10-CM | POA: Diagnosis not present

## 2023-02-18 DIAGNOSIS — Z7984 Long term (current) use of oral hypoglycemic drugs: Secondary | ICD-10-CM | POA: Diagnosis not present

## 2023-02-18 DIAGNOSIS — I10 Essential (primary) hypertension: Secondary | ICD-10-CM

## 2023-02-18 DIAGNOSIS — E119 Type 2 diabetes mellitus without complications: Secondary | ICD-10-CM

## 2023-02-18 DIAGNOSIS — H35372 Puckering of macula, left eye: Secondary | ICD-10-CM | POA: Diagnosis not present

## 2023-03-10 ENCOUNTER — Ambulatory Visit
Admission: EM | Admit: 2023-03-10 | Discharge: 2023-03-10 | Disposition: A | Discharge status: Home / Self Care | Payer: Medicare Other

## 2023-03-10 DIAGNOSIS — J209 Acute bronchitis, unspecified: Secondary | ICD-10-CM

## 2023-03-10 LAB — POCT RAPID STREP A (OFFICE): Rapid Strep A Screen: NEGATIVE

## 2023-03-10 MED ORDER — PREDNISONE 20 MG PO TABS: 20.0000 mg | ORAL_TABLET | Freq: Every day | ORAL | 0 refills | Status: AC

## 2023-03-10 MED ORDER — MONTELUKAST SODIUM 10 MG PO TABS: 10.0000 mg | ORAL_TABLET | Freq: Every day | ORAL | 0 refills | Status: DC

## 2023-03-10 MED ORDER — ALBUTEROL SULFATE HFA 108 (90 BASE) MCG/ACT IN AERS: 1.0000 | INHALATION_SPRAY | Freq: Four times a day (QID) | RESPIRATORY_TRACT | 0 refills | Status: DC | PRN

## 2023-03-10 NOTE — Discharge Instructions (Addendum)
Your strep test is negative. You have acute bronchitis which is usually viral or allergic in nature. Please read the attached handout. Take prednisone once daily with breakfast in the morning for the next 5 days.  If you develop worsening palpitations on this medication, please stop it. Use the inhaler 2 puffs every 4-6 hours as needed. Take the montelukast tablet once every night before bed.  This medication will make you slightly drowsy. Please stop taking numerous over-the-counter medications. 5 mL of Delsym twice daily is the preferred treatment for your cough.  Return or follow up with your PCP if symptoms persist >2 weeks.

## 2023-03-10 NOTE — ED Provider Notes (Signed)
[facility]    CSN: [id number] Arrival date & time: 03/10/23  6578      History   Chief Complaint Chief Complaint  Patient presents with   Cough   Sore Throat    HPI Terry [name] is a 67 y.o. female.   Pleasant 67 year old female presents today due to concerns of cough and a sore throat.  She reports that symptoms started on Friday.  She reports her throat is scratchy.  She endorses postnasal drainage and rhinorrhea.  She states the cough is worse at nighttime.  She denies a fever.  She has been taking DayQuil, NyQuil, Robitussin, and Delsym.  After taking all of these medications, she states she felt dizzy.  Denies body aches, fever, headaches, rash, GI symptoms.  No additional sick contacts. Does have hx of PVCs, on BB.   Cough Sore Throat    Past Medical History:  Diagnosis Date   Arthritis    Colon polyps    Diabetes (HCC)    Estrogen deficiency    Hypertension    Migraine     Patient Active Problem List   Diagnosis Date Noted   PVC (premature ventricular contraction) 07/04/2022   Abnormal mammogram 10/31/2020   Allergic rhinitis 10/31/2020   Cervical disc disease 10/31/2020   Colon cancer screening 10/31/2020   Gastroesophageal reflux disease 10/31/2020   Hypercalcemia 10/31/2020   Hypertension 10/31/2020   Insomnia 10/31/2020   Irritable bowel syndrome 10/31/2020   Lactose intolerance 10/31/2020   Generalized osteoarthritis of hand 10/31/2020   Long term (current) use of anticoagulants 10/31/2020   Osteoarthritis 10/31/2020   Migraine 10/31/2020   Personal history of colonic polyps 10/31/2020   Type 2 diabetes mellitus with other specified complication (HCC) 10/31/2020   Bunion 10/31/2020   Prediabetes 10/31/2020   Menopausal flushing 10/31/2020   Onychomycosis 07/25/2020   Bilateral bunions 07/25/2020   Estrogen deficiency     Past Surgical History:  Procedure Laterality Date   PARTIAL HYSTERECTOMY      OB History   No  obstetric history on file.      Home Medications    Prior to Admission medications   Medication Sig Start Date End Date Taking? Authorizing Provider  albuterol (VENTOLIN HFA) 108 (90 Base) MCG/ACT inhaler Inhale 1-2 puffs into the lungs every 6 (six) hours as needed for wheezing or shortness of breath. 03/10/23  Yes Rosaline Ezekiel L, PA  montelukast (SINGULAIR) 10 MG tablet Take 1 tablet (10 mg total) by mouth at bedtime. 03/10/23  Yes Lanny Donoso L, PA  omeprazole (PRILOSEC) 20 MG capsule Take 20 mg by mouth every morning. 12/16/22  Yes [provider]  predniSONE (DELTASONE) 20 MG tablet Take 1 tablet (20 mg total) by mouth daily with breakfast for 5 days. 03/10/23 03/15/23 Yes Christeena Krogh L, PA  amitriptyline (ELAVIL) 10 MG tablet Take 10 mg by mouth at bedtime. 07/04/20   [provider]  amLODipine (NORVASC) 5 MG tablet Take 1 tablet (5 mg total) by mouth daily. 11/27/21 12/10/22  Rema Fendt, NP  Blood Glucose Monitoring Suppl (TRUE METRIX METER) w/Device KIT Use as directed 11/07/20   Rema Fendt, NP  lisinopril-hydrochlorothiazide (ZESTORETIC) 20-12.5 MG tablet Take 1 tablet by mouth daily. 11/27/21 12/10/22  Rema Fendt, NP  Magnesium 200 MG TABS Take 1 tablet (200 mg total) by mouth at bedtime. 05/03/22   Meriam Sprague, MD  metFORMIN (GLUCOPHAGE) 500 MG tablet Take 1 tablet (500 mg total) by mouth  2 (two) times daily with a meal. 08/08/21 12/10/22  Rema Fendt, NP  metoprolol succinate (TOPROL XL) 25 MG 24 hr tablet Take 1 tablet (25 mg total) by mouth 2 (two) times daily. 05/21/22   Meriam Sprague, MD  promethazine (PHENERGAN) 12.5 MG suppository Place 1 suppository (12.5 mg total) rectally every 6 (six) hours as needed for nausea or vomiting. 05/08/21   Rema Fendt, NP  rosuvastatin (CRESTOR) 10 MG tablet Take 1 tablet (10 mg total) by mouth daily. 10/29/22   Meriam Sprague, MD  TRUEplus Lancets 28G MISC Use as directed 11/07/20    Rema Fendt, NP    Family History Family History  Problem Relation Age of Onset   Dementia Mother    Bladder Cancer Father    Breast cancer Sister    Colon cancer Neg Hx    Stomach cancer Neg Hx    Esophageal cancer Neg Hx     Social History Social History   Tobacco Use   Smoking status: Never   Smokeless tobacco: Never  Vaping Use   Vaping Use: Never used  Substance Use Topics   Alcohol use: Not Currently    Comment: socially   Drug use: No     Allergies   Codeine   Review of Systems Review of Systems  Respiratory:  Positive for cough.   As per HPI   Physical Exam Triage Vital Signs ED Triage Vitals  Enc Vitals Group     BP 03/10/23 1108 138/80     Pulse Rate 03/10/23 1108 (!) 54     Resp 03/10/23 1108 18     Temp 03/10/23 1108 98.9 F (37.2 C)     Temp src --      SpO2 03/10/23 1108 95 %     Weight --      Height --      Head Circumference --      Peak Flow --      Pain Score 03/10/23 1107 8     Pain Loc --      Pain Edu? --      Excl. in GC? --    No data found.  Updated Vital Signs BP 138/80   Pulse (!) 54   Temp 98.9 F (37.2 C)   Resp 18   SpO2 95%   Visual Acuity Right Eye Distance:   Left Eye Distance:   Bilateral Distance:    Right Eye Near:   Left Eye Near:    Bilateral Near:     Physical Exam Vitals and nursing note reviewed.  Constitutional:      General: She is not in acute distress.    Appearance: She is well-developed and normal weight. She is not ill-appearing, toxic-appearing or diaphoretic.  HENT:     Head: Normocephalic and atraumatic.     Right Ear: Tympanic membrane and ear canal normal. No drainage, swelling or tenderness. No middle ear effusion. Tympanic membrane is not erythematous.     Left Ear: Tympanic membrane and ear canal normal. No drainage, swelling or tenderness.  No middle ear effusion. Tympanic membrane is not erythematous.     Nose: No congestion or rhinorrhea.     Mouth/Throat:     Mouth:  Mucous membranes are moist. No oral lesions.     Pharynx: Oropharynx is clear. Uvula midline. No pharyngeal swelling, oropharyngeal exudate, posterior oropharyngeal erythema or uvula swelling.     Tonsils: No tonsillar exudate or tonsillar abscesses.  Eyes:  Extraocular Movements:     Right eye: Normal extraocular motion.     Left eye: Normal extraocular motion.     Conjunctiva/sclera: Conjunctivae normal.     Pupils: Pupils are equal, round, and reactive to light.  Cardiovascular:     Rate and Rhythm: Normal rate.     Heart sounds: Murmur heard.     Comments: Pt had what sounded like a possible S3; extra heart sound noted, several PVCs heard Pulmonary:     Effort: Pulmonary effort is normal. No respiratory distress.     Breath sounds: No stridor. Wheezing (generalized across posterior lung fields) present. No rhonchi or rales.  Chest:     Chest wall: No tenderness.  Musculoskeletal:     Cervical back: Normal range of motion and neck supple.  Lymphadenopathy:     Cervical: No cervical adenopathy.  Neurological:     Mental Status: She is alert.      UC Treatments / Results  Labs (all labs ordered are listed, but only abnormal results are displayed) Labs Reviewed  POCT RAPID STREP A (OFFICE)    EKG   Radiology No results found.  Procedures Procedures (including critical care time)  Medications Ordered in UC Medications - No data to display  Initial Impression / Assessment and Plan / UC Course  I have reviewed the triage vital signs and the nursing notes.  Pertinent labs & imaging results that were available during my care of the patient were reviewed by me and considered in my medical decision making (see chart for details).     Acute bronchitis -will do p.o. prednisone.  Recommended patient stop the medication if she develops any cardiovascular side effects.  Will use montelukast at nighttime.  2 puffs albuterol every 6 hours as needed for cough.  Patient's  rapid strep is negative and suspect this to be viral.   Final Clinical Impressions(s) / UC Diagnoses   Final diagnoses:  Acute bronchitis, unspecified organism     Discharge Instructions      Your strep test is negative. You have acute bronchitis which is usually viral or allergic in nature. Please read the attached handout. Take prednisone once daily with breakfast in the morning for the next 5 days.  If you develop worsening palpitations on this medication, please stop it. Use the inhaler 2 puffs every 4-6 hours as needed. Take the montelukast tablet once every night before bed.  This medication will make you slightly drowsy. Please stop taking numerous over-the-counter medications. 5 mL of Delsym twice daily is the preferred treatment for your cough.  Return or follow up with your PCP if symptoms persist >2 weeks.    ED Prescriptions     Medication Sig Dispense Auth. Provider   montelukast (SINGULAIR) 10 MG tablet Take 1 tablet (10 mg total) by mouth at bedtime. 10 tablet Neiman Roots L, PA   predniSONE (DELTASONE) 20 MG tablet Take 1 tablet (20 mg total) by mouth daily with breakfast for 5 days. 5 tablet Tatyanna Cronk L, PA   albuterol (VENTOLIN HFA) 108 (90 Base) MCG/ACT inhaler Inhale 1-2 puffs into the lungs every 6 (six) hours as needed for wheezing or shortness of breath. 8 g Barbera Perritt L, [place]      PDMP not reviewed this encounter.   Maretta Bees, [place] 03/10/23 1709

## 2023-03-10 NOTE — ED Triage Notes (Signed)
Pt presents with a sore throat and cough that she states started Friday. Patient states that her throat pain is about a 8.

## 2023-03-13 DIAGNOSIS — J40 Bronchitis, not specified as acute or chronic: Secondary | ICD-10-CM | POA: Diagnosis not present

## 2023-04-08 DIAGNOSIS — R059 Cough, unspecified: Secondary | ICD-10-CM | POA: Diagnosis not present

## 2023-04-08 DIAGNOSIS — J309 Allergic rhinitis, unspecified: Secondary | ICD-10-CM | POA: Diagnosis not present

## 2023-04-08 DIAGNOSIS — E1169 Type 2 diabetes mellitus with other specified complication: Secondary | ICD-10-CM | POA: Diagnosis not present

## 2023-04-08 DIAGNOSIS — K219 Gastro-esophageal reflux disease without esophagitis: Secondary | ICD-10-CM | POA: Diagnosis not present

## 2023-04-08 DIAGNOSIS — E119 Type 2 diabetes mellitus without complications: Secondary | ICD-10-CM | POA: Diagnosis not present

## 2023-04-08 DIAGNOSIS — I493 Ventricular premature depolarization: Secondary | ICD-10-CM | POA: Diagnosis not present

## 2023-04-08 DIAGNOSIS — Z23 Encounter for immunization: Secondary | ICD-10-CM | POA: Diagnosis not present

## 2023-04-08 DIAGNOSIS — G43909 Migraine, unspecified, not intractable, without status migrainosus: Secondary | ICD-10-CM | POA: Diagnosis not present

## 2023-04-08 DIAGNOSIS — M1990 Unspecified osteoarthritis, unspecified site: Secondary | ICD-10-CM | POA: Diagnosis not present

## 2023-04-08 DIAGNOSIS — Z0000 Encounter for general adult medical examination without abnormal findings: Secondary | ICD-10-CM | POA: Diagnosis not present

## 2023-04-08 DIAGNOSIS — I10 Essential (primary) hypertension: Secondary | ICD-10-CM | POA: Diagnosis not present

## 2023-05-13 DIAGNOSIS — I10 Essential (primary) hypertension: Secondary | ICD-10-CM | POA: Diagnosis not present

## 2023-05-20 ENCOUNTER — Ambulatory Visit: Payer: Medicare Other | Admitting: Cardiology

## 2023-05-30 ENCOUNTER — Other Ambulatory Visit: Payer: Self-pay | Admitting: Internal Medicine

## 2023-05-30 ENCOUNTER — Ambulatory Visit
Admission: RE | Admit: 2023-05-30 | Discharge: 2023-05-30 | Disposition: A | Discharge status: Home / Self Care | Payer: Medicare Other | Source: Ambulatory Visit | Attending: Internal Medicine | Admitting: Internal Medicine

## 2023-05-30 ENCOUNTER — Ambulatory Visit: Payer: Medicare Other

## 2023-05-30 DIAGNOSIS — E119 Type 2 diabetes mellitus without complications: Secondary | ICD-10-CM | POA: Diagnosis not present

## 2023-05-30 DIAGNOSIS — M542 Cervicalgia: Secondary | ICD-10-CM

## 2023-05-30 DIAGNOSIS — K219 Gastro-esophageal reflux disease without esophagitis: Secondary | ICD-10-CM | POA: Diagnosis not present

## 2023-05-30 DIAGNOSIS — M545 Low back pain, unspecified: Secondary | ICD-10-CM | POA: Diagnosis not present

## 2023-05-30 DIAGNOSIS — M549 Dorsalgia, unspecified: Secondary | ICD-10-CM | POA: Diagnosis not present

## 2023-05-30 DIAGNOSIS — I10 Essential (primary) hypertension: Secondary | ICD-10-CM | POA: Diagnosis not present

## 2023-06-19 ENCOUNTER — Ambulatory Visit: Payer: Medicare Other | Admitting: Cardiovascular Disease

## 2023-08-04 ENCOUNTER — Encounter: Payer: Self-pay | Admitting: Cardiovascular Disease

## 2023-08-04 NOTE — Progress Notes (Unsigned)
  Cardiology Office Note:  .   Date:  08/05/2023  ID:  Terry [name], DOB 1956/08/07, MRN [id number] PCP: Georgann Housekeeper, MD  [facility] Providers Cardiologist:  Meriam Sprague, MD (Inactive) {, Shari Prows, now Lavonta Tillis      History of Present Illness: .   Oct. 21, 2024    Terry [name] is a 67 y.o. female with hx of HTN   She is a former patient of Dr. Shari Prows Is being seen for the first time today by me   Hx of HTN, PVCs Had 38% PVC burden,  was seen by Dr. Ladona Ridgel for EP  The PVCs improved on beta blockers   BP is elevated. She has not been checking it regularly   Still eating processed foods ( hot dogs yesterday)  Not exercising as much Has gained some weight  Wt is 180 lbs      ROS:   Studies Reviewed: .         Risk Assessment/Calculations:     HYPERTENSION CONTROL Vitals:   08/05/23 1328 08/05/23 1341  BP: (!) 162/98 (!) 155/85    The patient's blood pressure is elevated above target today.  In order to address the patient's elevated BP:           Physical Exam:   VS:  BP (!) 155/85   Pulse 96   Ht 5\' 5"  (1.651 m)   Wt 180 lb 3.2 oz (81.7 kg)   SpO2 99%   BMI 29.99 kg/m    Wt Readings from Last 3 Encounters:  08/05/23 180 lb 3.2 oz (81.7 kg)  12/10/22 178 lb 9.6 oz (81 kg)  08/13/22 170 lb (77.1 kg)    GEN: Well nourished, well developed in no acute distress NECK: No JVD; No carotid bruits CARDIAC: RRR, no murmurs, rubs, gallops,  occasional premature beats  RESPIRATORY:  Clear to auscultation without rales, wheezing or rhonchi  ABDOMEN: Soft, non-tender, non-distended EXTREMITIES:  No edema; No deformity   ASSESSMENT AND PLAN: .    Hypertension: Terry [name] presents for further evaluation of her hypertension.  She has gained a little bit of weight.  She admits to eating a little bit more salt than she should.  We will double her losartan HCT to 100 mg / 25 mg once a day.  There is an old prescription for lisinopril HCT still  on her medical record.  Will take this off her medical record.  Potassium chloride 10 mill equivalents a day.  Check basic metabolic profile in 2 weeks.  Will have her see an APP for recheck in 3 months.         Dispo: 3 months with app   Signed, Kristeen Miss, MD

## 2023-08-05 ENCOUNTER — Ambulatory Visit: Payer: Medicare Other | Attending: Cardiology | Admitting: Cardiovascular Disease

## 2023-08-05 ENCOUNTER — Encounter: Payer: Self-pay | Admitting: Cardiovascular Disease

## 2023-08-05 VITALS — BP 155/85 | HR 96 | Ht 65.0 in | Wt 180.2 lb

## 2023-08-05 DIAGNOSIS — Z09 Encounter for follow-up examination after completed treatment for conditions other than malignant neoplasm: Secondary | ICD-10-CM | POA: Diagnosis not present

## 2023-08-05 DIAGNOSIS — I10 Essential (primary) hypertension: Secondary | ICD-10-CM

## 2023-08-05 MED ORDER — POTASSIUM CHLORIDE ER 10 MEQ PO TBCR: 10.0000 meq | EXTENDED_RELEASE_TABLET | Freq: Every day | ORAL | 3 refills | Status: AC

## 2023-08-05 MED ORDER — LOSARTAN POTASSIUM-HCTZ 100-25 MG PO TABS: 1.0000 | ORAL_TABLET | Freq: Every day | ORAL | 3 refills | Status: AC

## 2023-08-05 NOTE — Patient Instructions (Signed)
Medication Instructions:  Your physician has recommended you make the following change in your medication:  1-STOP Lisinopril/hydrochlorothiazide 2-START Losartan/hydrochlorothiazide 100/25 mg by mouth daily. 3-START potassium (Kdur) 10 meq by mouth daily  *If you need a refill on your cardiac medications before your next appointment, please call your pharmacy*  Lab Work: Your physician recommends that you return for lab work in: 2 weeks for BMET  If you have labs (blood work) drawn today and your tests are completely normal, you will receive your results only by: MyChart Message (if you have MyChart) OR A paper copy in the mail If you have any lab test that is abnormal or we need to change your treatment, we will call you to review the results.  Testing/Procedures: None ordered today.   Follow-Up: At [facility], you and your health needs are our priority.  As part of our continuing mission to provide you with exceptional heart care, we have created designated Provider Care Teams.  These Care Teams include your primary Cardiologist (physician) and Advanced Practice Providers (APPs -  Physician Assistants and Nurse Practitioners) who all work together to provide you with the care you need, when you need it.  We recommend signing up for the patient portal called "MyChart".  Sign up information is provided on this After Visit Summary.  MyChart is used to connect with patients for Virtual Visits (Telemedicine).  Patients are able to view lab/test results, encounter notes, upcoming appointments, etc.  Non-urgent messages can be sent to your provider as well.   To learn more about what you can do with MyChart, go to [email or web address].    Your next appointment:   3 month(s)  Provider:   NP or PA

## 2023-08-12 DIAGNOSIS — R1110 Vomiting, unspecified: Secondary | ICD-10-CM | POA: Diagnosis not present

## 2023-08-12 DIAGNOSIS — R232 Flushing: Secondary | ICD-10-CM | POA: Diagnosis not present

## 2023-08-15 NOTE — Progress Notes (Signed)
[facility] - Clinic Note  08/19/2023     CHIEF COMPLAINT Patient presents for Retina Follow Up   HISTORY OF PRESENT ILLNESS: Terry [name] is a 67 y.o. female who presents to the clinic today for:   HPI     Retina Follow Up   In left eye.  This started 6 months ago.  Duration of 6 months.  Since onset it is stable.  I, the attending physician,  performed the HPI with the patient and updated documentation appropriately.        Comments   6 month retina follow up ERM OS pt is reporting no vision changes noticed she denies any flashes or floaters pt is reporting she having some itching and waters she is not currently using any gtts her last reading was last week unsure what number was       Last edited by Rennis Chris, MD on 08/20/2023  9:56 PM.    Pt states her left eye was a little blurry when reading the eye chart this morning  Referring physician: Georgann Housekeeper, MD [address] [place],  [place] [address]  HISTORICAL INFORMATION:   Selected notes from the MEDICAL RECORD NUMBER Referred by Dr. Donette Larry for DM exam LEE:  Ocular Hx- PMH-    CURRENT MEDICATIONS: No current outpatient medications on file. (Ophthalmic Drugs)   No current facility-administered medications for this visit. (Ophthalmic Drugs)   Current Outpatient Medications (Other)  Medication Sig   albuterol (VENTOLIN HFA) 108 (90 Base) MCG/ACT inhaler Inhale 1-2 puffs into the lungs every 6 (six) hours as needed for wheezing or shortness of breath.   amitriptyline (ELAVIL) 10 MG tablet Take 10 mg by mouth at bedtime.   amLODipine (NORVASC) 5 MG tablet Take 1 tablet (5 mg total) by mouth daily.   Blood Glucose Monitoring Suppl (TRUE METRIX METER) w/Device KIT Use as directed   losartan-hydrochlorothiazide (HYZAAR) 100-25 MG tablet Take 1 tablet by mouth daily.   Magnesium 200 MG TABS Take 1 tablet (200 mg total) by mouth at bedtime.   metFORMIN (GLUCOPHAGE) 500 MG  tablet Take 1 tablet (500 mg total) by mouth 2 (two) times daily with a meal.   metoprolol succinate (TOPROL XL) 25 MG 24 hr tablet Take 1 tablet (25 mg total) by mouth 2 (two) times daily.   montelukast (SINGULAIR) 10 MG tablet Take 1 tablet (10 mg total) by mouth at bedtime. (Patient not taking: Reported on 08/05/2023)   omeprazole (PRILOSEC) 20 MG capsule Take 20 mg by mouth every morning.   potassium chloride (KLOR-CON) 10 MEQ tablet Take 1 tablet (10 mEq total) by mouth daily.   promethazine (PHENERGAN) 12.5 MG suppository Place 1 suppository (12.5 mg total) rectally every 6 (six) hours as needed for nausea or vomiting.   rosuvastatin (CRESTOR) 10 MG tablet Take 1 tablet (10 mg total) by mouth daily.   TRUEplus Lancets 28G MISC Use as directed   No current facility-administered medications for this visit. (Other)   REVIEW OF SYSTEMS: ROS   Positive for: Gastrointestinal, Endocrine, Eyes Last edited by Etheleen Mayhew, COT on 08/19/2023  9:16 AM.      ALLERGIES Allergies  Allergen Reactions   Codeine Nausea And Vomiting   PAST MEDICAL HISTORY Past Medical History:  Diagnosis Date   Arthritis    Colon polyps    Diabetes (HCC)    Estrogen deficiency    Hypertension    Migraine    Past Surgical  History:  Procedure Laterality Date   PARTIAL HYSTERECTOMY     FAMILY HISTORY Family History  Problem Relation Age of Onset   Dementia Mother    Bladder Cancer Father    Breast cancer Sister    Colon cancer Neg Hx    Stomach cancer Neg Hx    Esophageal cancer Neg Hx    SOCIAL HISTORY Social History   Tobacco Use   Smoking status: Never   Smokeless tobacco: Never  Vaping Use   Vaping status: Never Used  Substance Use Topics   Alcohol use: Not Currently    Comment: socially   Drug use: No       OPHTHALMIC EXAM:  Base Eye Exam     Visual Acuity (Snellen - Linear)       Right Left   Dist [place] 20/20 20/25 -2   Dist ph [place]  NI         Tonometry (Tonopen,  9:20 AM)       Right Left   Pressure 18 18         Pupils       Pupils Dark Light Shape React APD   Right PERRL 3 2 Round Brisk None   Left PERRL 3 2 Round Brisk None         Visual Fields       Left Right    Full Full         Extraocular Movement       Right Left    Full, Ortho Full, Ortho         Neuro/Psych     Oriented x3: Yes   Mood/Affect: Normal         Dilation     Both eyes: 2.5% Phenylephrine @ 9:20 AM           Slit Lamp and Fundus Exam     Slit Lamp Exam       Right Left   Lids/Lashes Dermatochalasis - upper lid, mild MGD Dermatochalasis - upper lid, mild MGD   Conjunctiva/Sclera White and quiet nasal pingeucula   Cornea mild arcus, mild pterygium nasally mild arcus   Anterior Chamber deep and clear deep and clear   Iris Round and dilated, No NVI Round and dilated, No NVI   Lens 2-3+ Nuclear sclerosis, 2-3+ Cortical cataract 2-3+ Nuclear sclerosis, 2-3+ Cortical cataract   Anterior Vitreous mild syneresis mild syneresis         Fundus Exam       Right Left   Disc Pink and Sharp Pink and Sharp, mild PPA   C/D Ratio 0.3 0.2   Macula Flat, Good foveal reflex, No heme or edema Flat, Good foveal reflex, ERM with early striae, No heme or edema   Vessels mild attenuation mild attenuation   Periphery Attached, No heme Attached, No heme            IMAGING AND PROCEDURES  Imaging and Procedures for 08/19/2023  OCT, Retina - OU - Both Eyes       Right Eye Quality was good. Central Foveal Thickness: 271. Progression has been stable. Findings include normal foveal contour, no IRF, no SRF (Partial PVD).   Left Eye Quality was good. Central Foveal Thickness: 359. Progression has been stable. Findings include no IRF, no SRF, abnormal foveal contour, epiretinal membrane, macular pucker (ERM with very mild, early pucker, Partial PVD).   Notes *Images captured and stored on drive  Diagnosis / Impression:  No DME OU  OD: NFP, no  IRF/SRF OS: ERM with very mild, early pucker, Partial PVD  Clinical management:  See below  Abbreviations: NFP - Normal foveal profile. CME - cystoid macular edema. PED - pigment epithelial detachment. IRF - intraretinal fluid. SRF - subretinal fluid. EZ - ellipsoid zone. ERM - epiretinal membrane. ORA - outer retinal atrophy. ORT - outer retinal tubulation. SRHM - subretinal hyper-reflective material. IRHM - intraretinal hyper-reflective material            ASSESSMENT/PLAN:    ICD-10-CM   1. Diabetes mellitus type 2 without retinopathy (HCC)  E11.9 OCT, Retina - OU - Both Eyes    2. Long term (current) use of oral hypoglycemic drugs  Z79.84     3. Essential hypertension  I10     4. Hypertensive retinopathy of both eyes  H35.033     5. Epiretinal membrane (ERM) of left eye  H35.372 OCT, Retina - OU - Both Eyes    6. Combined forms of age-related cataract of both eyes  H25.813      1,2. Diabetes mellitus, type 2 without retinopathy  - A1c: 6.4 on 01.22.24 - The incidence, risk factors for progression, natural history and treatment options for diabetic retinopathy  were discussed with patient.   - The need for close monitoring of blood glucose, blood pressure, and serum lipids, avoiding cigarette or any type of tobacco, and the need for long term follow up was also discussed with patient. - f/u in 1 year, sooner prn  3,4. Hypertensive retinopathy OU - discussed importance of tight BP control - monitor  5. Epiretinal membrane, left eye  - mild ERM - BCVA 20/25 -- decreased from 20/20 - asymptomatic, no metamorphopsia - no indication for surgery at this time - monitor for now - f/u 6-9 months DFE, OCT  6. Mixed Cataract OU - The symptoms of cataract, surgical options, and treatments and risks were discussed with patient. - discussed diagnosis and progression - will refer to Dr. Zenaida Niece for consult and establishment of primary eye care  Ophthalmic Meds Ordered this visit:   No orders of the defined types were placed in this encounter.    Return for f/u 6-9 months, ERM OS, DFE, OCT.  There are no Patient Instructions on file for this visit.   Explained the diagnoses, plan, and follow up with the patient and they expressed understanding.  Patient expressed understanding of the importance of proper follow up care.   This document serves as a record of services personally performed by Karie Chimera, MD, PhD. It was created on their behalf by Annalee Genta, COMT. The creation of this record is the provider's dictation and/or activities during the visit.  Electronically signed by: Annalee Genta, COMT 08/20/23 9:56 PM  This document serves as a record of services personally performed by Karie Chimera, MD, PhD. It was created on their behalf by Glee Arvin. Manson Passey, OA an ophthalmic technician. The creation of this record is the provider's dictation and/or activities during the visit.    Electronically signed by: Glee Arvin. Manson Passey, OA 08/20/23 9:56 PM   Karie Chimera, M.D., Ph.D. Diseases & Surgery of the Retina and Vitreous [facility]  I have reviewed the above documentation for accuracy and completeness, and I agree with the above. Karie Chimera, M.D., Ph.D. 08/20/23 9:58 PM   Abbreviations: M myopia (nearsighted); A astigmatism; H hyperopia (farsighted); P presbyopia; Mrx spectacle prescription;  CTL contact lenses; OD right eye; OS  left eye; OU both eyes  XT exotropia; ET esotropia; PEK punctate epithelial keratitis; PEE punctate epithelial erosions; DES dry eye syndrome; MGD meibomian gland dysfunction; ATs artificial tears; PFAT's preservative free artificial tears; NSC nuclear sclerotic cataract; PSC posterior subcapsular cataract; ERM epi-retinal membrane; PVD posterior vitreous detachment; RD retinal detachment; DM diabetes mellitus; DR diabetic retinopathy; NPDR non-proliferative diabetic retinopathy; PDR proliferative diabetic  retinopathy; CSME clinically significant macular edema; DME diabetic macular edema; dbh dot blot hemorrhages; CWS cotton wool spot; POAG primary open angle glaucoma; C/D cup-to-disc ratio; HVF humphrey visual field; GVF goldmann visual field; OCT optical coherence tomography; IOP intraocular pressure; BRVO Branch retinal vein occlusion; CRVO central retinal vein occlusion; CRAO central retinal artery occlusion; BRAO branch retinal artery occlusion; RT retinal tear; SB scleral buckle; PPV pars plana vitrectomy; VH Vitreous hemorrhage; PRP panretinal laser photocoagulation; IVK intravitreal kenalog; VMT vitreomacular traction; MH Macular hole;  NVD neovascularization of the disc; NVE neovascularization elsewhere; AREDS age related eye disease study; ARMD age related macular degeneration; POAG primary open angle glaucoma; EBMD epithelial/anterior basement membrane dystrophy; ACIOL anterior chamber intraocular lens; IOL intraocular lens; PCIOL posterior chamber intraocular lens; Phaco/IOL phacoemulsification with intraocular lens placement; PRK photorefractive keratectomy; LASIK laser assisted in situ keratomileusis; HTN hypertension; DM diabetes mellitus; COPD chronic obstructive pulmonary disease

## 2023-08-19 ENCOUNTER — Encounter (INDEPENDENT_AMBULATORY_CARE_PROVIDER_SITE_OTHER): Payer: Self-pay | Admitting: Ophthalmology

## 2023-08-19 ENCOUNTER — Ambulatory Visit (INDEPENDENT_AMBULATORY_CARE_PROVIDER_SITE_OTHER): Payer: Medicare Other | Admitting: Ophthalmology

## 2023-08-19 DIAGNOSIS — H35033 Hypertensive retinopathy, bilateral: Secondary | ICD-10-CM

## 2023-08-19 DIAGNOSIS — H25813 Combined forms of age-related cataract, bilateral: Secondary | ICD-10-CM

## 2023-08-19 DIAGNOSIS — I10 Essential (primary) hypertension: Secondary | ICD-10-CM

## 2023-08-19 DIAGNOSIS — E119 Type 2 diabetes mellitus without complications: Secondary | ICD-10-CM | POA: Diagnosis not present

## 2023-08-19 DIAGNOSIS — H35372 Puckering of macula, left eye: Secondary | ICD-10-CM | POA: Diagnosis not present

## 2023-08-19 DIAGNOSIS — Z7984 Long term (current) use of oral hypoglycemic drugs: Secondary | ICD-10-CM | POA: Diagnosis not present

## 2023-08-20 ENCOUNTER — Encounter (INDEPENDENT_AMBULATORY_CARE_PROVIDER_SITE_OTHER): Payer: Self-pay | Admitting: Ophthalmology

## 2023-08-20 ENCOUNTER — Other Ambulatory Visit: Payer: Self-pay | Admitting: Cardiovascular Disease

## 2023-08-20 DIAGNOSIS — I10 Essential (primary) hypertension: Secondary | ICD-10-CM | POA: Diagnosis not present

## 2023-08-20 DIAGNOSIS — Z09 Encounter for follow-up examination after completed treatment for conditions other than malignant neoplasm: Secondary | ICD-10-CM | POA: Diagnosis not present

## 2023-08-21 LAB — BASIC METABOLIC PANEL
BUN/Creatinine Ratio: 22 (ref 12–28)
BUN: 16 mg/dL (ref 8–27)
CO2: 27 mmol/L (ref 20–29)
Calcium: 9.9 mg/dL (ref 8.7–10.3)
Chloride: 102 mmol/L (ref 96–106)
Creatinine, Ser: 0.73 mg/dL (ref 0.57–1.00)
Glucose: 108 mg/dL — ABNORMAL HIGH (ref 70–99)
Potassium: 4 mmol/L (ref 3.5–5.2)
Sodium: 141 mmol/L (ref 134–144)
eGFR: 91 mL/min/{1.73_m2} (ref >59)

## 2023-09-16 ENCOUNTER — Ambulatory Visit: Payer: Medicare Other | Admitting: Cardiovascular Disease

## 2023-09-30 DIAGNOSIS — I10 Essential (primary) hypertension: Secondary | ICD-10-CM | POA: Diagnosis not present

## 2023-09-30 DIAGNOSIS — Z23 Encounter for immunization: Secondary | ICD-10-CM | POA: Diagnosis not present

## 2023-09-30 DIAGNOSIS — G43909 Migraine, unspecified, not intractable, without status migrainosus: Secondary | ICD-10-CM | POA: Diagnosis not present

## 2023-09-30 DIAGNOSIS — E782 Mixed hyperlipidemia: Secondary | ICD-10-CM | POA: Diagnosis not present

## 2023-09-30 DIAGNOSIS — M1990 Unspecified osteoarthritis, unspecified site: Secondary | ICD-10-CM | POA: Diagnosis not present

## 2023-09-30 DIAGNOSIS — E119 Type 2 diabetes mellitus without complications: Secondary | ICD-10-CM | POA: Diagnosis not present

## 2023-09-30 DIAGNOSIS — E1169 Type 2 diabetes mellitus with other specified complication: Secondary | ICD-10-CM | POA: Diagnosis not present

## 2023-11-03 NOTE — Progress Notes (Unsigned)
Cardiology Office Note:  .   Date:  11/04/2023  ID:  Terry [name], DOB October 10, 1956, MRN [id number] PCP: Georgann Housekeeper, MD  [facility] Providers Cardiologist:  Meriam Sprague, MD (Inactive) {  History of Present Illness: Marland Kitchen   Terry [name] is a 68 y.o. female with a past medical history of HTN, PVCs with PVC burden of 38% seen by Dr. Ladona Ridgel (improved on beta-blockers), hypertension here for follow-up appointment.  Previously seen by Dr. Shari Prows but was seen by Dr. Elease Hashimoto.  Not checking blood pressure regularly.  Still eating processed foods and not watching sodium.  Not exercising as much.  Has gained some weight unfortunately.  Today, she presents with a history of hypertension, hyperlipidemia, and diabetes for a follow-up visit. She reports that despite attempts to modify her diet, she has not seen significant changes in her weight. She notes that her weight has remained stable, neither increasing nor decreasing significantly. She expresses frustration with her inability to lose weight, particularly around her midsection, despite efforts to exercise and eat healthily.  The patient denies any issues with her heart rhythm or any problems with her current medications, which include amlodipine 5mg  daily, Hyzaar 100/25mg  daily, metoprolol succinate 25mg  twice daily, Crestor, and potassium and magnesium supplements. She also takes metformin twice daily for diabetes management.  The patient reports that she has been unable to monitor her blood sugar levels due to issues with her glucometer. She has requested a new glucometer and testing supplies.  Reports no shortness of breath nor dyspnea on exertion. Reports no chest pain, pressure, or tightness. No edema, orthopnea, PND. Reports no palpitations.   Discussed the use of AI scribe software for clinical note transcription with the patient, who gave verbal consent to proceed.  ROS: pertinent ROS in HPI   Studies Reviewed: Marland Kitchen        Long-term monitor 07/04/2022  NSR with sinus tachycardia Rare NSVT Rare PAC's Frequent PVC's No atrial fib or flutter No sustained VT or SVT   Gregg Taylor,MD   Patch Wear Time:  1 days and 21 hours (2024-03-02T13:29:10-498 to 2024-03-04T10:53:01-499)   Patient had a min HR of 64 bpm, max HR of 128 bpm, and avg HR of 89 bpm. Predominant underlying rhythm was Sinus Rhythm. First Degree AV Block was present. 1 run of Ventricular Tachycardia occurred lasting 6 beats with a max rate of 119 bpm (avg 100  bpm). Isolated SVEs were rare (<1.0%), and no SVE Couplets or SVE Triplets were present. Isolated VEs were frequent (15.7%, 36543), VE Couplets were rare (<1.0%, 4), and no VE Triplets were present. Ventricular Bigeminy and Trigeminy were present.      Physical Exam:   VS:  BP 136/78   Pulse 82   Ht 5\' 5"  (1.651 m)   Wt 176 lb 12.8 oz (80.2 kg)   SpO2 96%   BMI 29.42 kg/m    Wt Readings from Last 3 Encounters:  11/04/23 176 lb 12.8 oz (80.2 kg)  08/05/23 180 lb 3.2 oz (81.7 kg)  12/10/22 178 lb 9.6 oz (81 kg)    GEN: Well nourished, well developed in no acute distress NECK: No JVD; No carotid bruits CARDIAC: RRR, no murmurs, rubs, gallops RESPIRATORY:  Clear to auscultation without rales, wheezing or rhonchi  ABDOMEN: Soft, non-tender, non-distended EXTREMITIES:  No edema; No deformity   ASSESSMENT AND PLAN: .    Hypertension Improved blood pressure control. No changes in lifestyle modifications. Medications include Amlodipine 5mg  daily and  Hyzaar 125mg  daily. -Continue current medications. -Encourage continued lifestyle modifications.  Overweight BMI 29. Difficulty losing weight despite attempts at dietary changes. Discussed referral to [facility] for additional support and potential medication options. -Refer to [facility] for further management.  Hyperlipidemia Well controlled on Crestor. Last lipid panel in summer 2024 showed good  control. -Continue Crestor. -Repeat lipid panel in summer 2025.  Type 2 Diabetes Mellitus A1C creeping up to 6.6. Patient on Metformin twice daily. Glucometer not working properly. -Encourage regular blood glucose monitoring. -Order new glucometer and lancets for patient.  Premature Ventricular Contractions (PVCs) No current symptoms. On Metoprolol succinate 25mg  twice daily. -Continue Metoprolol succinate. -Monitor for any new symptoms.    Dispo: She can follow-up in a year with Dr. Elease Hashimoto or Dr. Anne Fu  Signed, Sharlene Dory, PA-C

## 2023-11-04 ENCOUNTER — Encounter: Payer: Self-pay | Admitting: Physician Assistant

## 2023-11-04 ENCOUNTER — Ambulatory Visit: Payer: Medicare Other | Attending: Physician Assistant | Admitting: Physician Assistant

## 2023-11-04 VITALS — BP 136/78 | HR 82 | Ht 65.0 in | Wt 176.8 lb

## 2023-11-04 DIAGNOSIS — Z79899 Other long term (current) drug therapy: Secondary | ICD-10-CM

## 2023-11-04 DIAGNOSIS — E663 Overweight: Secondary | ICD-10-CM

## 2023-11-04 DIAGNOSIS — I10 Essential (primary) hypertension: Secondary | ICD-10-CM | POA: Diagnosis not present

## 2023-11-04 DIAGNOSIS — E1169 Type 2 diabetes mellitus with other specified complication: Secondary | ICD-10-CM

## 2023-11-04 DIAGNOSIS — I493 Ventricular premature depolarization: Secondary | ICD-10-CM

## 2023-11-04 MED ORDER — TRUE METRIX METER W/DEVICE KIT
PACK | 0 refills | Status: AC
Start: 2023-11-04 — End: ?

## 2023-11-04 MED ORDER — ROSUVASTATIN CALCIUM 10 MG PO TABS: 10.0000 mg | ORAL_TABLET | Freq: Every day | ORAL | 3 refills | Status: AC

## 2023-11-04 MED ORDER — METOPROLOL SUCCINATE ER 25 MG PO TB24: 25.0000 mg | ORAL_TABLET | Freq: Two times a day (BID) | ORAL | 3 refills | Status: AC

## 2023-11-04 NOTE — Patient Instructions (Signed)
Medication Instructions:   Your physician recommends that you continue on your current medications as directed. Please refer to the Current Medication list given to you today.   *If you need a refill on your cardiac medications before your next appointment, please call your pharmacy*   Lab Work:  NONE ORDERED  TODAY     If you have labs (blood work) drawn today and your tests are completely normal, you will receive your results only by: MyChart Message (if you have MyChart) OR A paper copy in the mail If you have any lab test that is abnormal or we need to change your treatment, we will call you to review the results.   Testing/Procedures: NONE ORDERED  TODAY      Follow-Up: At [facility], you and your health needs are our priority.  As part of our continuing mission to provide you with exceptional heart care, we have created designated Provider Care Teams.  These Care Teams include your primary Cardiologist (physician) and Advanced Practice Providers (APPs -  Physician Assistants and Nurse Practitioners) who all work together to provide you with the care you need, when you need it.  We recommend signing up for the patient portal called "MyChart".  Sign up information is provided on this After Visit Summary.  MyChart is used to connect with patients for Virtual Visits (Telemedicine).  Patients are able to view lab/test results, encounter notes, upcoming appointments, etc.  Non-urgent messages can be sent to your provider as well.   To learn more about what you can do with MyChart, go to [email or web address].    Your next appointment: [facility] have been [facility] to [facility]   1 year(s)  Provider:    Dr. Elease Hashimoto    Other Instructions

## 2023-12-10 DIAGNOSIS — J069 Acute upper respiratory infection, unspecified: Secondary | ICD-10-CM | POA: Diagnosis not present

## 2023-12-26 DIAGNOSIS — E119 Type 2 diabetes mellitus without complications: Secondary | ICD-10-CM | POA: Diagnosis not present

## 2023-12-26 DIAGNOSIS — H25813 Combined forms of age-related cataract, bilateral: Secondary | ICD-10-CM | POA: Diagnosis not present

## 2023-12-26 DIAGNOSIS — H524 Presbyopia: Secondary | ICD-10-CM | POA: Diagnosis not present

## 2023-12-26 DIAGNOSIS — H35372 Puckering of macula, left eye: Secondary | ICD-10-CM | POA: Diagnosis not present

## 2024-03-27 DIAGNOSIS — L821 Other seborrheic keratosis: Secondary | ICD-10-CM | POA: Diagnosis not present

## 2024-03-27 DIAGNOSIS — R1013 Epigastric pain: Secondary | ICD-10-CM | POA: Diagnosis not present

## 2024-04-14 ENCOUNTER — Other Ambulatory Visit: Payer: Self-pay | Admitting: Internal Medicine

## 2024-04-14 DIAGNOSIS — M5090 Cervical disc disorder, unspecified, unspecified cervical region: Secondary | ICD-10-CM | POA: Diagnosis not present

## 2024-04-14 DIAGNOSIS — K219 Gastro-esophageal reflux disease without esophagitis: Secondary | ICD-10-CM | POA: Diagnosis not present

## 2024-04-14 DIAGNOSIS — J309 Allergic rhinitis, unspecified: Secondary | ICD-10-CM | POA: Diagnosis not present

## 2024-04-14 DIAGNOSIS — E782 Mixed hyperlipidemia: Secondary | ICD-10-CM | POA: Diagnosis not present

## 2024-04-14 DIAGNOSIS — E1169 Type 2 diabetes mellitus with other specified complication: Secondary | ICD-10-CM | POA: Diagnosis not present

## 2024-04-14 DIAGNOSIS — Z1231 Encounter for screening mammogram for malignant neoplasm of breast: Secondary | ICD-10-CM

## 2024-04-14 DIAGNOSIS — G43909 Migraine, unspecified, not intractable, without status migrainosus: Secondary | ICD-10-CM | POA: Diagnosis not present

## 2024-04-14 DIAGNOSIS — I10 Essential (primary) hypertension: Secondary | ICD-10-CM | POA: Diagnosis not present

## 2024-04-14 DIAGNOSIS — I493 Ventricular premature depolarization: Secondary | ICD-10-CM | POA: Diagnosis not present

## 2024-04-14 DIAGNOSIS — R140 Abdominal distension (gaseous): Secondary | ICD-10-CM | POA: Diagnosis not present

## 2024-04-14 DIAGNOSIS — Z0000 Encounter for general adult medical examination without abnormal findings: Secondary | ICD-10-CM | POA: Diagnosis not present

## 2024-04-14 DIAGNOSIS — M1990 Unspecified osteoarthritis, unspecified site: Secondary | ICD-10-CM | POA: Diagnosis not present

## 2024-05-01 DIAGNOSIS — R110 Nausea: Secondary | ICD-10-CM | POA: Diagnosis not present

## 2024-05-01 DIAGNOSIS — R140 Abdominal distension (gaseous): Secondary | ICD-10-CM | POA: Diagnosis not present

## 2024-05-01 DIAGNOSIS — R198 Other specified symptoms and signs involving the digestive system and abdomen: Secondary | ICD-10-CM | POA: Diagnosis not present

## 2024-05-01 DIAGNOSIS — K219 Gastro-esophageal reflux disease without esophagitis: Secondary | ICD-10-CM | POA: Diagnosis not present

## 2024-05-01 DIAGNOSIS — E739 Lactose intolerance, unspecified: Secondary | ICD-10-CM | POA: Diagnosis not present

## 2024-05-07 NOTE — Progress Notes (Shared)
 [facility] - Clinic Note  05/18/2024     CHIEF COMPLAINT Patient presents for No chief complaint on file.   HISTORY OF PRESENT ILLNESS: Terry [name] is a 68 y.o. female who presents to the clinic today for:    Pt states her left eye was a little blurry when reading the eye chart this morning  Referring physician: Husain, Karrar, MD [address] [place],  [place] [address]  HISTORICAL INFORMATION:   Selected notes from the MEDICAL RECORD NUMBER Referred by Dr. Husain for DM exam LEE:  Ocular Hx- PMH-    CURRENT MEDICATIONS: No current outpatient medications on file. (Ophthalmic Drugs)   No current facility-administered medications for this visit. (Ophthalmic Drugs)   Current Outpatient Medications (Other)  Medication Sig   amitriptyline (ELAVIL) 10 MG tablet Take 10 mg by mouth at bedtime.   amLODipine  (NORVASC ) 5 MG tablet Take 1 tablet (5 mg total) by mouth daily.   Blood Glucose Monitoring Suppl (TRUE METRIX METER) w/Device KIT Use as directed   losartan -hydrochlorothiazide  (HYZAAR) 100-25 MG tablet Take 1 tablet by mouth daily.   Magnesium  200 MG TABS Take 1 tablet (200 mg total) by mouth at bedtime.   metFORMIN  (GLUCOPHAGE ) 500 MG tablet Take 1 tablet (500 mg total) by mouth 2 (two) times daily with a meal.   metoprolol  succinate (TOPROL  XL) 25 MG 24 hr tablet Take 1 tablet (25 mg total) by mouth 2 (two) times daily.   omeprazole (PRILOSEC) 20 MG capsule Take 20 mg by mouth every morning.   potassium chloride  (KLOR-CON ) 10 MEQ tablet Take 1 tablet (10 mEq total) by mouth daily.   promethazine  (PHENERGAN ) 12.5 MG suppository Place 1 suppository (12.5 mg total) rectally every 6 (six) hours as needed for nausea or vomiting.   rosuvastatin  (CRESTOR ) 10 MG tablet Take 1 tablet (10 mg total) by mouth daily.   TRUEplus Lancets 28G MISC Use as directed   No current facility-administered medications for this visit. (Other)   REVIEW OF  SYSTEMS:    ALLERGIES Allergies  Allergen Reactions   Codeine Nausea And Vomiting   PAST MEDICAL HISTORY Past Medical History:  Diagnosis Date   Arthritis    Colon polyps    Diabetes (HCC)    Estrogen deficiency    Hypertension    Migraine    Past Surgical History:  Procedure Laterality Date   PARTIAL HYSTERECTOMY     FAMILY HISTORY Family History  Problem Relation Age of Onset   Dementia Mother    Bladder Cancer Father    Breast cancer Sister    Colon cancer Neg Hx    Stomach cancer Neg Hx    Esophageal cancer Neg Hx    SOCIAL HISTORY Social History   Tobacco Use   Smoking status: Never   Smokeless tobacco: Never  Vaping Use   Vaping status: Never Used  Substance Use Topics   Alcohol use: Not Currently    Comment: socially   Drug use: No       OPHTHALMIC EXAM:  Not recorded     IMAGING AND PROCEDURES  Imaging and Procedures for 05/18/2024          ASSESSMENT/PLAN:    ICD-10-CM   1. Diabetes mellitus type 2 without retinopathy (HCC)  E11.9     2. Long term (current) use of oral hypoglycemic drugs  Z79.84     3. Essential hypertension  I10     4. Hypertensive retinopathy of  both eyes  H35.033     5. Epiretinal membrane (ERM) of left eye  H35.372     6. Combined forms of age-related cataract of both eyes  H25.813       1,2. Diabetes mellitus, type 2 without retinopathy  - A1c: 6.4 on 01.22.24 - The incidence, risk factors for progression, natural history and treatment options for diabetic retinopathy  were discussed with patient.   - The need for close monitoring of blood glucose, blood pressure, and serum lipids, avoiding cigarette or any type of tobacco, and the need for long term follow up was also discussed with patient. - f/u in 1 year, sooner prn  3,4. Hypertensive retinopathy OU - discussed importance of tight BP control - monitor  5. Epiretinal membrane, left eye  - mild ERM - BCVA 20/25 -- decreased from 20/20 -  asymptomatic, no metamorphopsia - no indication for surgery at this time - monitor for now - f/u 6-9 months DFE, OCT  6. Mixed Cataract OU - The symptoms of cataract, surgical options, and treatments and risks were discussed with patient. - discussed diagnosis and progression - will refer to Dr. Fleeta for consult and establishment of primary eye care  Ophthalmic Meds Ordered this visit:  No orders of the defined types were placed in this encounter.    No follow-ups on file.  There are no Patient Instructions on file for this visit.   Explained the diagnoses, plan, and follow up with the patient and they expressed understanding.  Patient expressed understanding of the importance of proper follow up care.   This document serves as a record of services personally performed by Redell JUDITHANN Hans, MD, PhD. It was created on their behalf by Avelina Pereyra, COA an ophthalmic technician. The creation of this record is the provider's dictation and/or activities during the visit.   Electronically signed by: Avelina GORMAN Pereyra, COT  05/07/24  1:58 PM     Redell JUDITHANN Hans, M.D., Ph.D. Diseases & Surgery of the Retina and Vitreous [facility]     Abbreviations: M myopia (nearsighted); A astigmatism; H hyperopia (farsighted); P presbyopia; Mrx spectacle prescription;  CTL contact lenses; OD right eye; OS left eye; OU both eyes  XT exotropia; ET esotropia; PEK punctate epithelial keratitis; PEE punctate epithelial erosions; DES dry eye syndrome; MGD meibomian gland dysfunction; ATs artificial tears; PFAT's preservative free artificial tears; NSC nuclear sclerotic cataract; PSC posterior subcapsular cataract; ERM epi-retinal membrane; PVD posterior vitreous detachment; RD retinal detachment; DM diabetes mellitus; DR diabetic retinopathy; NPDR non-proliferative diabetic retinopathy; PDR proliferative diabetic retinopathy; CSME clinically significant macular edema; DME diabetic macular  edema; dbh dot blot hemorrhages; CWS cotton wool spot; POAG primary open angle glaucoma; C/D cup-to-disc ratio; HVF humphrey visual field; GVF goldmann visual field; OCT optical coherence tomography; IOP intraocular pressure; BRVO Branch retinal vein occlusion; CRVO central retinal vein occlusion; CRAO central retinal artery occlusion; BRAO branch retinal artery occlusion; RT retinal tear; SB scleral buckle; PPV pars plana vitrectomy; VH Vitreous hemorrhage; PRP panretinal laser photocoagulation; IVK intravitreal kenalog; VMT vitreomacular traction; MH Macular hole;  NVD neovascularization of the disc; NVE neovascularization elsewhere; AREDS age related eye disease study; ARMD age related macular degeneration; POAG primary open angle glaucoma; EBMD epithelial/anterior basement membrane dystrophy; ACIOL anterior chamber intraocular lens; IOL intraocular lens; PCIOL posterior chamber intraocular lens; Phaco/IOL phacoemulsification with intraocular lens placement; PRK photorefractive keratectomy; LASIK laser assisted in situ keratomileusis; HTN hypertension; DM diabetes mellitus; COPD chronic obstructive pulmonary disease

## 2024-05-13 NOTE — Progress Notes (Shared)
 [facility] - Clinic Note  05/25/2024     CHIEF COMPLAINT Patient presents for No chief complaint on file.   HISTORY OF PRESENT ILLNESS: Terry [name] is a 68 y.o. female who presents to the clinic today for:    Pt states her left eye was a little blurry when reading the eye chart this morning  Referring physician: Husain, Karrar, MD [address] [place],  [place] [address]  HISTORICAL INFORMATION:   Selected notes from the MEDICAL RECORD NUMBER Referred by Dr. Husain for DM exam LEE:  Ocular Hx- PMH-    CURRENT MEDICATIONS: No current outpatient medications on file. (Ophthalmic Drugs)   No current facility-administered medications for this visit. (Ophthalmic Drugs)   Current Outpatient Medications (Other)  Medication Sig   amitriptyline (ELAVIL) 10 MG tablet Take 10 mg by mouth at bedtime.   amLODipine  (NORVASC ) 5 MG tablet Take 1 tablet (5 mg total) by mouth daily.   Blood Glucose Monitoring Suppl (TRUE METRIX METER) w/Device KIT Use as directed   losartan -hydrochlorothiazide  (HYZAAR) 100-25 MG tablet Take 1 tablet by mouth daily.   Magnesium  200 MG TABS Take 1 tablet (200 mg total) by mouth at bedtime.   metFORMIN  (GLUCOPHAGE ) 500 MG tablet Take 1 tablet (500 mg total) by mouth 2 (two) times daily with a meal.   metoprolol  succinate (TOPROL  XL) 25 MG 24 hr tablet Take 1 tablet (25 mg total) by mouth 2 (two) times daily.   omeprazole (PRILOSEC) 20 MG capsule Take 20 mg by mouth every morning.   potassium chloride  (KLOR-CON ) 10 MEQ tablet Take 1 tablet (10 mEq total) by mouth daily.   promethazine  (PHENERGAN ) 12.5 MG suppository Place 1 suppository (12.5 mg total) rectally every 6 (six) hours as needed for nausea or vomiting.   rosuvastatin  (CRESTOR ) 10 MG tablet Take 1 tablet (10 mg total) by mouth daily.   TRUEplus Lancets 28G MISC Use as directed   No current facility-administered medications for this visit. (Other)   REVIEW OF  SYSTEMS:    ALLERGIES Allergies  Allergen Reactions   Codeine Nausea And Vomiting   PAST MEDICAL HISTORY Past Medical History:  Diagnosis Date   Arthritis    Colon polyps    Diabetes (HCC)    Estrogen deficiency    Hypertension    Migraine    Past Surgical History:  Procedure Laterality Date   PARTIAL HYSTERECTOMY     FAMILY HISTORY Family History  Problem Relation Age of Onset   Dementia Mother    Bladder Cancer Father    Breast cancer Sister    Colon cancer Neg Hx    Stomach cancer Neg Hx    Esophageal cancer Neg Hx    SOCIAL HISTORY Social History   Tobacco Use   Smoking status: Never   Smokeless tobacco: Never  Vaping Use   Vaping status: Never Used  Substance Use Topics   Alcohol use: Not Currently    Comment: socially   Drug use: No       OPHTHALMIC EXAM:  Not recorded     IMAGING AND PROCEDURES  Imaging and Procedures for 05/25/2024          ASSESSMENT/PLAN:    ICD-10-CM   1. Diabetes mellitus type 2 without retinopathy (HCC)  E11.9     2. Long term (current) use of oral hypoglycemic drugs  Z79.84     3. Essential hypertension  I10     4. Hypertensive retinopathy of  both eyes  H35.033     5. Epiretinal membrane (ERM) of left eye  H35.372     6. Combined forms of age-related cataract of both eyes  H25.813        1,2. Diabetes mellitus, type 2 without retinopathy  - A1c: 6.4 on 01.22.24 - The incidence, risk factors for progression, natural history and treatment options for diabetic retinopathy  were discussed with patient.   - The need for close monitoring of blood glucose, blood pressure, and serum lipids, avoiding cigarette or any type of tobacco, and the need for long term follow up was also discussed with patient. - f/u in 1 year, sooner prn  3,4. Hypertensive retinopathy OU - discussed importance of tight BP control - monitor  5. Epiretinal membrane, left eye  - mild ERM - BCVA 20/25 -- decreased from 20/20 -  asymptomatic, no metamorphopsia - no indication for surgery at this time - monitor for now - f/u 6-9 months DFE, OCT  6. Mixed Cataract OU - The symptoms of cataract, surgical options, and treatments and risks were discussed with patient. - discussed diagnosis and progression - will refer to Dr. Fleeta for consult and establishment of primary eye care  Ophthalmic Meds Ordered this visit:  No orders of the defined types were placed in this encounter.    No follow-ups on file.  There are no Patient Instructions on file for this visit.   Explained the diagnoses, plan, and follow up with the patient and they expressed understanding.  Patient expressed understanding of the importance of proper follow up care.   This document serves as a record of services personally performed by Redell JUDITHANN Hans, MD, PhD. It was created on their behalf by Avelina Pereyra, COA an ophthalmic technician. The creation of this record is the provider's dictation and/or activities during the visit.   Electronically signed by: Avelina GORMAN Pereyra, COT  05/13/24  2:19 PM     Redell JUDITHANN Hans, M.D., Ph.D. Diseases & Surgery of the Retina and Vitreous [facility]     Abbreviations: M myopia (nearsighted); A astigmatism; H hyperopia (farsighted); P presbyopia; Mrx spectacle prescription;  CTL contact lenses; OD right eye; OS left eye; OU both eyes  XT exotropia; ET esotropia; PEK punctate epithelial keratitis; PEE punctate epithelial erosions; DES dry eye syndrome; MGD meibomian gland dysfunction; ATs artificial tears; PFAT's preservative free artificial tears; NSC nuclear sclerotic cataract; PSC posterior subcapsular cataract; ERM epi-retinal membrane; PVD posterior vitreous detachment; RD retinal detachment; DM diabetes mellitus; DR diabetic retinopathy; NPDR non-proliferative diabetic retinopathy; PDR proliferative diabetic retinopathy; CSME clinically significant macular edema; DME diabetic macular  edema; dbh dot blot hemorrhages; CWS cotton wool spot; POAG primary open angle glaucoma; C/D cup-to-disc ratio; HVF humphrey visual field; GVF goldmann visual field; OCT optical coherence tomography; IOP intraocular pressure; BRVO Branch retinal vein occlusion; CRVO central retinal vein occlusion; CRAO central retinal artery occlusion; BRAO branch retinal artery occlusion; RT retinal tear; SB scleral buckle; PPV pars plana vitrectomy; VH Vitreous hemorrhage; PRP panretinal laser photocoagulation; IVK intravitreal kenalog; VMT vitreomacular traction; MH Macular hole;  NVD neovascularization of the disc; NVE neovascularization elsewhere; AREDS age related eye disease study; ARMD age related macular degeneration; POAG primary open angle glaucoma; EBMD epithelial/anterior basement membrane dystrophy; ACIOL anterior chamber intraocular lens; IOL intraocular lens; PCIOL posterior chamber intraocular lens; Phaco/IOL phacoemulsification with intraocular lens placement; PRK photorefractive keratectomy; LASIK laser assisted in situ keratomileusis; HTN hypertension; DM diabetes mellitus; COPD chronic obstructive pulmonary  disease

## 2024-05-14 DIAGNOSIS — M1990 Unspecified osteoarthritis, unspecified site: Secondary | ICD-10-CM | POA: Diagnosis not present

## 2024-05-14 DIAGNOSIS — E782 Mixed hyperlipidemia: Secondary | ICD-10-CM | POA: Diagnosis not present

## 2024-05-14 DIAGNOSIS — J40 Bronchitis, not specified as acute or chronic: Secondary | ICD-10-CM | POA: Diagnosis not present

## 2024-05-18 ENCOUNTER — Encounter (INDEPENDENT_AMBULATORY_CARE_PROVIDER_SITE_OTHER): Payer: Medicare Other | Admitting: Ophthalmology

## 2024-05-18 DIAGNOSIS — E119 Type 2 diabetes mellitus without complications: Secondary | ICD-10-CM

## 2024-05-18 DIAGNOSIS — H25813 Combined forms of age-related cataract, bilateral: Secondary | ICD-10-CM

## 2024-05-18 DIAGNOSIS — H35033 Hypertensive retinopathy, bilateral: Secondary | ICD-10-CM

## 2024-05-18 DIAGNOSIS — Z7984 Long term (current) use of oral hypoglycemic drugs: Secondary | ICD-10-CM

## 2024-05-18 DIAGNOSIS — I10 Essential (primary) hypertension: Secondary | ICD-10-CM

## 2024-05-18 DIAGNOSIS — H35372 Puckering of macula, left eye: Secondary | ICD-10-CM

## 2024-05-25 ENCOUNTER — Encounter (INDEPENDENT_AMBULATORY_CARE_PROVIDER_SITE_OTHER): Admitting: Ophthalmology

## 2024-05-25 DIAGNOSIS — E119 Type 2 diabetes mellitus without complications: Secondary | ICD-10-CM

## 2024-05-25 DIAGNOSIS — H25813 Combined forms of age-related cataract, bilateral: Secondary | ICD-10-CM

## 2024-05-25 DIAGNOSIS — Z7984 Long term (current) use of oral hypoglycemic drugs: Secondary | ICD-10-CM

## 2024-05-25 DIAGNOSIS — H35372 Puckering of macula, left eye: Secondary | ICD-10-CM

## 2024-05-25 DIAGNOSIS — I10 Essential (primary) hypertension: Secondary | ICD-10-CM

## 2024-05-25 DIAGNOSIS — H35033 Hypertensive retinopathy, bilateral: Secondary | ICD-10-CM

## 2024-05-26 ENCOUNTER — Ambulatory Visit
Admission: RE | Admit: 2024-05-26 | Discharge: 2024-05-26 | Disposition: A | Discharge status: Home / Self Care | Source: Ambulatory Visit | Attending: Internal Medicine | Admitting: Internal Medicine

## 2024-05-26 DIAGNOSIS — Z1231 Encounter for screening mammogram for malignant neoplasm of breast: Secondary | ICD-10-CM

## 2024-06-08 NOTE — Progress Notes (Shared)
 [facility] - Clinic Note  06/22/2024     CHIEF COMPLAINT Patient presents for No chief complaint on file.   HISTORY OF PRESENT ILLNESS: Terry [name] is a 68 y.o. female who presents to the clinic today for:    Pt states her left eye was a little blurry when reading the eye chart this morning  Referring physician: Husain, Karrar, MD [address] [place],  [place] [address]  HISTORICAL INFORMATION:   Selected notes from the MEDICAL RECORD NUMBER Referred by Dr. Husain for DM exam LEE:  Ocular Hx- PMH-    CURRENT MEDICATIONS: No current outpatient medications on file. (Ophthalmic Drugs)   No current facility-administered medications for this visit. (Ophthalmic Drugs)   Current Outpatient Medications (Other)  Medication Sig   amitriptyline (ELAVIL) 10 MG tablet Take 10 mg by mouth at bedtime.   amLODipine  (NORVASC ) 5 MG tablet Take 1 tablet (5 mg total) by mouth daily.   Blood Glucose Monitoring Suppl (TRUE METRIX METER) w/Device KIT Use as directed   losartan -hydrochlorothiazide  (HYZAAR) 100-25 MG tablet Take 1 tablet by mouth daily.   Magnesium  200 MG TABS Take 1 tablet (200 mg total) by mouth at bedtime.   metFORMIN  (GLUCOPHAGE ) 500 MG tablet Take 1 tablet (500 mg total) by mouth 2 (two) times daily with a meal.   metoprolol  succinate (TOPROL  XL) 25 MG 24 hr tablet Take 1 tablet (25 mg total) by mouth 2 (two) times daily.   omeprazole (PRILOSEC) 20 MG capsule Take 20 mg by mouth every morning.   potassium chloride  (KLOR-CON ) 10 MEQ tablet Take 1 tablet (10 mEq total) by mouth daily.   promethazine  (PHENERGAN ) 12.5 MG suppository Place 1 suppository (12.5 mg total) rectally every 6 (six) hours as needed for nausea or vomiting.   rosuvastatin  (CRESTOR ) 10 MG tablet Take 1 tablet (10 mg total) by mouth daily.   TRUEplus Lancets 28G MISC Use as directed   No current facility-administered medications for this visit. (Other)   REVIEW OF  SYSTEMS:    ALLERGIES Allergies  Allergen Reactions   Codeine Nausea And Vomiting   PAST MEDICAL HISTORY Past Medical History:  Diagnosis Date   Arthritis    Colon polyps    Diabetes (HCC)    Estrogen deficiency    Hypertension    Migraine    Past Surgical History:  Procedure Laterality Date   PARTIAL HYSTERECTOMY     FAMILY HISTORY Family History  Problem Relation Age of Onset   Dementia Mother    Bladder Cancer Father    Breast cancer Sister    Colon cancer Neg Hx    Stomach cancer Neg Hx    Esophageal cancer Neg Hx    SOCIAL HISTORY Social History   Tobacco Use   Smoking status: Never   Smokeless tobacco: Never  Vaping Use   Vaping status: Never Used  Substance Use Topics   Alcohol use: Not Currently    Comment: socially   Drug use: No       OPHTHALMIC EXAM:  Not recorded     IMAGING AND PROCEDURES  Imaging and Procedures for 06/22/2024          ASSESSMENT/PLAN:    ICD-10-CM   1. Diabetes mellitus type 2 without retinopathy (HCC)  E11.9     2. Long term (current) use of oral hypoglycemic drugs  Z79.84     3. Essential hypertension  I10     4. Hypertensive retinopathy of  both eyes  H35.033     5. Epiretinal membrane (ERM) of left eye  H35.372     6. Combined forms of age-related cataract of both eyes  H25.813         1,2. Diabetes mellitus, type 2 without retinopathy  - A1c: 6.4 on 01.22.24 - The incidence, risk factors for progression, natural history and treatment options for diabetic retinopathy  were discussed with patient.   - The need for close monitoring of blood glucose, blood pressure, and serum lipids, avoiding cigarette or any type of tobacco, and the need for long term follow up was also discussed with patient. - f/u in 1 year, sooner prn  3,4. Hypertensive retinopathy OU - discussed importance of tight BP control - monitor  5. Epiretinal membrane, left eye  - mild ERM - BCVA 20/25 -- decreased from 20/20 -  asymptomatic, no metamorphopsia - no indication for surgery at this time - monitor for now - f/u 6-9 months DFE, OCT  6. Mixed Cataract OU - The symptoms of cataract, surgical options, and treatments and risks were discussed with patient. - discussed diagnosis and progression - will refer to Dr. Fleeta for consult and establishment of primary eye care  Ophthalmic Meds Ordered this visit:  No orders of the defined types were placed in this encounter.    No follow-ups on file.  There are no Patient Instructions on file for this visit.   Explained the diagnoses, plan, and follow up with the patient and they expressed understanding.  Patient expressed understanding of the importance of proper follow up care.   This document serves as a record of services personally performed by Redell JUDITHANN Hans, MD, PhD. It was created on their behalf by Avelina Pereyra, COA an ophthalmic technician. The creation of this record is the provider's dictation and/or activities during the visit.   Electronically signed by: Avelina GORMAN Pereyra, COT  06/08/24  10:41 AM     Redell JUDITHANN Hans, M.D., Ph.D. Diseases & Surgery of the Retina and Vitreous [facility]     Abbreviations: M myopia (nearsighted); A astigmatism; H hyperopia (farsighted); P presbyopia; Mrx spectacle prescription;  CTL contact lenses; OD right eye; OS left eye; OU both eyes  XT exotropia; ET esotropia; PEK punctate epithelial keratitis; PEE punctate epithelial erosions; DES dry eye syndrome; MGD meibomian gland dysfunction; ATs artificial tears; PFAT's preservative free artificial tears; NSC nuclear sclerotic cataract; PSC posterior subcapsular cataract; ERM epi-retinal membrane; PVD posterior vitreous detachment; RD retinal detachment; DM diabetes mellitus; DR diabetic retinopathy; NPDR non-proliferative diabetic retinopathy; PDR proliferative diabetic retinopathy; CSME clinically significant macular edema; DME diabetic macular  edema; dbh dot blot hemorrhages; CWS cotton wool spot; POAG primary open angle glaucoma; C/D cup-to-disc ratio; HVF humphrey visual field; GVF goldmann visual field; OCT optical coherence tomography; IOP intraocular pressure; BRVO Branch retinal vein occlusion; CRVO central retinal vein occlusion; CRAO central retinal artery occlusion; BRAO branch retinal artery occlusion; RT retinal tear; SB scleral buckle; PPV pars plana vitrectomy; VH Vitreous hemorrhage; PRP panretinal laser photocoagulation; IVK intravitreal kenalog; VMT vitreomacular traction; MH Macular hole;  NVD neovascularization of the disc; NVE neovascularization elsewhere; AREDS age related eye disease study; ARMD age related macular degeneration; POAG primary open angle glaucoma; EBMD epithelial/anterior basement membrane dystrophy; ACIOL anterior chamber intraocular lens; IOL intraocular lens; PCIOL posterior chamber intraocular lens; Phaco/IOL phacoemulsification with intraocular lens placement; PRK photorefractive keratectomy; LASIK laser assisted in situ keratomileusis; HTN hypertension; DM diabetes mellitus; COPD chronic obstructive  pulmonary disease

## 2024-06-14 DIAGNOSIS — J40 Bronchitis, not specified as acute or chronic: Secondary | ICD-10-CM | POA: Diagnosis not present

## 2024-06-14 DIAGNOSIS — M1990 Unspecified osteoarthritis, unspecified site: Secondary | ICD-10-CM | POA: Diagnosis not present

## 2024-06-14 DIAGNOSIS — E782 Mixed hyperlipidemia: Secondary | ICD-10-CM | POA: Diagnosis not present

## 2024-06-22 ENCOUNTER — Encounter (INDEPENDENT_AMBULATORY_CARE_PROVIDER_SITE_OTHER): Admitting: Ophthalmology

## 2024-06-22 ENCOUNTER — Encounter (INDEPENDENT_AMBULATORY_CARE_PROVIDER_SITE_OTHER): Payer: Self-pay

## 2024-06-22 DIAGNOSIS — H35372 Puckering of macula, left eye: Secondary | ICD-10-CM

## 2024-06-22 DIAGNOSIS — I10 Essential (primary) hypertension: Secondary | ICD-10-CM

## 2024-06-22 DIAGNOSIS — E119 Type 2 diabetes mellitus without complications: Secondary | ICD-10-CM

## 2024-06-22 DIAGNOSIS — H25813 Combined forms of age-related cataract, bilateral: Secondary | ICD-10-CM

## 2024-06-22 DIAGNOSIS — Z7984 Long term (current) use of oral hypoglycemic drugs: Secondary | ICD-10-CM

## 2024-06-22 DIAGNOSIS — H35033 Hypertensive retinopathy, bilateral: Secondary | ICD-10-CM

## 2024-07-15 NOTE — Progress Notes (Signed)
 [facility] - Clinic Note  07/17/2024     CHIEF COMPLAINT Patient presents for Retina Follow Up   HISTORY OF PRESENT ILLNESS: Terry [name] is a 68 y.o. female who presents to the clinic today for:   HPI     Retina Follow Up   Patient presents with  Other.  In both eyes.  This started 11 months ago.  I, the attending physician,  performed the HPI with the patient and updated documentation appropriately.        Comments   Patient here for 9 months (11 months) retina follow up for ERM OS. Patient states vision doesn't know. Has glasses not working. Not happy with new glasses. No eye pain.       Last edited by Valdemar Rogue, MD on 07/17/2024  5:50 PM.     Pt states she is having a hard time seeing at a distance with her glasses and needs to see [facility].   Referring physician: Husain, Karrar, MD [address] [place],  [place] [address]  HISTORICAL INFORMATION:   Selected notes from the MEDICAL RECORD NUMBER Referred by Dr. Husain for DM exam LEE:  Ocular Hx- PMH-    CURRENT MEDICATIONS: No current outpatient medications on file. (Ophthalmic Drugs)   No current facility-administered medications for this visit. (Ophthalmic Drugs)   Current Outpatient Medications (Other)  Medication Sig   amitriptyline (ELAVIL) 10 MG tablet Take 10 mg by mouth at bedtime.   Blood Glucose Monitoring Suppl (TRUE METRIX METER) w/Device KIT Use as directed   losartan -hydrochlorothiazide  (HYZAAR) 100-25 MG tablet Take 1 tablet by mouth daily.   Magnesium  200 MG TABS Take 1 tablet (200 mg total) by mouth at bedtime.   metoprolol  succinate (TOPROL  XL) 25 MG 24 hr tablet Take 1 tablet (25 mg total) by mouth 2 (two) times daily.   promethazine  (PHENERGAN ) 12.5 MG suppository Place 1 suppository (12.5 mg total) rectally every 6 (six) hours as needed for nausea or vomiting.   rosuvastatin  (CRESTOR ) 10 MG tablet Take 1 tablet (10 mg total) by mouth daily.    TRUEplus Lancets 28G MISC Use as directed   amLODipine  (NORVASC ) 5 MG tablet Take 1 tablet (5 mg total) by mouth daily.   metFORMIN  (GLUCOPHAGE ) 500 MG tablet Take 1 tablet (500 mg total) by mouth 2 (two) times daily with a meal.   omeprazole (PRILOSEC) 20 MG capsule Take 20 mg by mouth every morning.   potassium chloride  (KLOR-CON ) 10 MEQ tablet Take 1 tablet (10 mEq total) by mouth daily.   No current facility-administered medications for this visit. (Other)   REVIEW OF SYSTEMS: ROS   Positive for: Gastrointestinal, Endocrine, Eyes Last edited by Orval Asberry RAMAN, COA on 07/17/2024  8:15 AM.       ALLERGIES Allergies  Allergen Reactions   Codeine Nausea And Vomiting   PAST MEDICAL HISTORY Past Medical History:  Diagnosis Date   Arthritis    Colon polyps    Diabetes (HCC)    Estrogen deficiency    Hypertension    Migraine    Past Surgical History:  Procedure Laterality Date   PARTIAL HYSTERECTOMY     FAMILY HISTORY Family History  Problem Relation Age of Onset   Dementia Mother    Bladder Cancer Father    Breast cancer Sister    Colon cancer Neg Hx    Stomach cancer Neg Hx    Esophageal cancer Neg Hx    SOCIAL HISTORY  Social History   Tobacco Use   Smoking status: Never   Smokeless tobacco: Never  Vaping Use   Vaping status: Never Used  Substance Use Topics   Alcohol use: Not Currently    Comment: socially   Drug use: No       OPHTHALMIC EXAM:  Base Eye Exam     Visual Acuity (Snellen - Linear)       Right Left   Dist Delta 20/20 20/25 -1         Tonometry (Tonopen, 8:12 AM)       Right Left   Pressure 20 16         Pupils       Dark Light Shape React APD   Right 3 2 Round Brisk None   Left 3 2 Round Brisk None         Visual Fields (Counting fingers)       Left Right    Full Full         Extraocular Movement       Right Left    Full, Ortho Full, Ortho         Neuro/Psych     Oriented x3: Yes   Mood/Affect:  Normal         Dilation     Both eyes: 1.0% Mydriacyl, 2.5% Phenylephrine @ 8:12 AM           Slit Lamp and Fundus Exam     Slit Lamp Exam       Right Left   Lids/Lashes Dermatochalasis - upper lid, mild MGD Dermatochalasis - upper lid, mild MGD   Conjunctiva/Sclera White and quiet nasal pingeucula   Cornea mild arcus, mild pterygium nasally mild arcus, trace PEE   Anterior Chamber deep and clear deep and clear   Iris Round and dilated, No NVI Round and dilated, No NVI   Lens 2-3+ Nuclear sclerosis, 2-3+ Cortical cataract 2-3+ Nuclear sclerosis, 2-3+ Cortical cataract   Anterior Vitreous mild syneresis mild syneresis         Fundus Exam       Right Left   Disc Pink and Sharp Pink and Sharp, mild PPP   C/D Ratio 0.2 0.2   Macula Flat, Good foveal reflex, No heme or edema Flat, Good foveal reflex, ERM with mild striae, No heme or edema   Vessels mild attenuation, Tortuous mild attenuation, Vascular attenuation   Periphery Attached, No heme Attached, No heme            IMAGING AND PROCEDURES  Imaging and Procedures for 07/17/2024  OCT, Retina - OU - Both Eyes       Right Eye Quality was good. Central Foveal Thickness: 250. Progression has been stable. Findings include normal foveal contour, no IRF, no SRF (Partial PVD).   Left Eye Quality was good. Central Foveal Thickness: 373. Progression has been stable. Findings include no IRF, no SRF, abnormal foveal contour, epiretinal membrane, macular pucker (ERM with blunting of foveal contour, trace cystic changes and mild pucker; Partial PVD).   Notes *Images captured and stored on drive  Diagnosis / Impression:  No DME OU OD: NFP, no IRF/SRF OS: ERM with blunting of foveal contour, trace cystic changes and mild pucker; Partial PVD  Clinical management:  See below  Abbreviations: NFP - Normal foveal profile. CME - cystoid macular edema. PED - pigment epithelial detachment. IRF - intraretinal fluid. SRF -  subretinal fluid. EZ - ellipsoid zone. ERM - epiretinal membrane. ORA -  outer retinal atrophy. ORT - outer retinal tubulation. SRHM - subretinal hyper-reflective material. IRHM - intraretinal hyper-reflective material            ASSESSMENT/PLAN:    ICD-10-CM   1. Diabetes mellitus type 2 without retinopathy (HCC)  E11.9 OCT, Retina - OU - Both Eyes    2. Long term (current) use of oral hypoglycemic drugs  Z79.84     3. Essential hypertension  I10     4. Hypertensive retinopathy of both eyes  H35.033     5. Epiretinal membrane (ERM) of left eye  H35.372 OCT, Retina - OU - Both Eyes    6. Combined forms of age-related cataract of both eyes  H25.813      1,2. Diabetes mellitus, type 2 without retinopathy  - A1c 6.6 (07.01.25), 6.4 (01.22.24) - The incidence, risk factors for progression, natural history and treatment options for diabetic retinopathy  were discussed with patient.   - The need for close monitoring of blood glucose, blood pressure, and serum lipids, avoiding cigarette or any type of tobacco, and the need for long term follow up was also discussed with patient. - f/u in 9-12 months, sooner prn  3,4. Hypertensive retinopathy OU - discussed importance of tight BP control - monitor  5. Epiretinal membrane, left eye  - mild ERM - BCVA 20/25 -- decreased from 20/20 - asymptomatic, no metamorphopsia - no indication for surgery at this time - monitor for now - f/u 9-12 months DFE, OCT  6. Mixed Cataract OU - The symptoms of cataract, surgical options, and treatments and risks were discussed with patient. - discussed diagnosis and progression - will refer to Dr. Fleeta for consult and establishment of primary eye care  Ophthalmic Meds Ordered this visit:  No orders of the defined types were placed in this encounter.    Return in about 1 year (around 07/17/2025) for f/u DM , DFE, OCT.  There are no Patient Instructions on file for this visit.  This document serves  as a record of services personally performed by Redell JUDITHANN Hans, MD, PhD. It was created on their behalf by Delon Newness COT, an ophthalmic technician. The creation of this record is the provider's dictation and/or activities during the visit.    Electronically signed by: Delon Newness COT 10.01.2025  5:51 PM  This document serves as a record of services personally performed by Redell JUDITHANN Hans, MD, PhD. It was created on their behalf by Wanda GEANNIE Keens, COT an ophthalmic technician. The creation of this record is the provider's dictation and/or activities during the visit.    Electronically signed by:  Wanda GEANNIE Keens, COT  07/17/24 5:51 PM  Redell JUDITHANN Hans, M.D., Ph.D. Diseases & Surgery of the Retina and Vitreous [facility] 07/17/2024   I have reviewed the above documentation for accuracy and completeness, and I agree with the above. Redell JUDITHANN Hans, M.D., Ph.D. 07/17/24 5:53 PM   Abbreviations: M myopia (nearsighted); A astigmatism; H hyperopia (farsighted); P presbyopia; Mrx spectacle prescription;  CTL contact lenses; OD right eye; OS left eye; OU both eyes  XT exotropia; ET esotropia; PEK punctate epithelial keratitis; PEE punctate epithelial erosions; DES dry eye syndrome; MGD meibomian gland dysfunction; ATs artificial tears; PFAT's preservative free artificial tears; NSC nuclear sclerotic cataract; PSC posterior subcapsular cataract; ERM epi-retinal membrane; PVD posterior vitreous detachment; RD retinal detachment; DM diabetes mellitus; DR diabetic retinopathy; NPDR non-proliferative diabetic retinopathy; PDR proliferative diabetic retinopathy; CSME clinically significant macular  edema; DME diabetic macular edema; dbh dot blot hemorrhages; CWS cotton wool spot; POAG primary open angle glaucoma; C/D cup-to-disc ratio; HVF humphrey visual field; GVF goldmann visual field; OCT optical coherence tomography; IOP intraocular pressure; BRVO Branch  retinal vein occlusion; CRVO central retinal vein occlusion; CRAO central retinal artery occlusion; BRAO branch retinal artery occlusion; RT retinal tear; SB scleral buckle; PPV pars plana vitrectomy; VH Vitreous hemorrhage; PRP panretinal laser photocoagulation; IVK intravitreal kenalog; VMT vitreomacular traction; MH Macular hole;  NVD neovascularization of the disc; NVE neovascularization elsewhere; AREDS age related eye disease study; ARMD age related macular degeneration; POAG primary open angle glaucoma; EBMD epithelial/anterior basement membrane dystrophy; ACIOL anterior chamber intraocular lens; IOL intraocular lens; PCIOL posterior chamber intraocular lens; Phaco/IOL phacoemulsification with intraocular lens placement; PRK photorefractive keratectomy; LASIK laser assisted in situ keratomileusis; HTN hypertension; DM diabetes mellitus; COPD chronic obstructive pulmonary disease

## 2024-07-17 ENCOUNTER — Ambulatory Visit (INDEPENDENT_AMBULATORY_CARE_PROVIDER_SITE_OTHER): Admitting: Ophthalmology

## 2024-07-17 ENCOUNTER — Encounter (INDEPENDENT_AMBULATORY_CARE_PROVIDER_SITE_OTHER): Payer: Self-pay | Admitting: Ophthalmology

## 2024-07-17 DIAGNOSIS — Z7984 Long term (current) use of oral hypoglycemic drugs: Secondary | ICD-10-CM | POA: Diagnosis not present

## 2024-07-17 DIAGNOSIS — E119 Type 2 diabetes mellitus without complications: Secondary | ICD-10-CM

## 2024-07-17 DIAGNOSIS — I10 Essential (primary) hypertension: Secondary | ICD-10-CM

## 2024-07-17 DIAGNOSIS — H35033 Hypertensive retinopathy, bilateral: Secondary | ICD-10-CM | POA: Diagnosis not present

## 2024-07-17 DIAGNOSIS — H35372 Puckering of macula, left eye: Secondary | ICD-10-CM

## 2024-07-17 DIAGNOSIS — H25813 Combined forms of age-related cataract, bilateral: Secondary | ICD-10-CM | POA: Diagnosis not present

## 2024-07-27 DIAGNOSIS — H25813 Combined forms of age-related cataract, bilateral: Secondary | ICD-10-CM | POA: Diagnosis not present

## 2024-09-08 ENCOUNTER — Encounter: Payer: Self-pay | Admitting: Cardiovascular Disease

## 2025-07-19 ENCOUNTER — Encounter (INDEPENDENT_AMBULATORY_CARE_PROVIDER_SITE_OTHER): Admitting: Ophthalmology
